# Patient Record
Sex: Female | Born: 1965
Health system: Southern US, Community
[De-identification: ages and names within clinical notes are randomized; demographics above are authoritative.]

## PROBLEM LIST (undated history)

## (undated) DIAGNOSIS — K219 Gastro-esophageal reflux disease without esophagitis: Secondary | ICD-10-CM

## (undated) DIAGNOSIS — M1990 Unspecified osteoarthritis, unspecified site: Secondary | ICD-10-CM

## (undated) DIAGNOSIS — Z9889 Other specified postprocedural states: Secondary | ICD-10-CM

## (undated) DIAGNOSIS — R112 Nausea with vomiting, unspecified: Secondary | ICD-10-CM

## (undated) DIAGNOSIS — G4730 Sleep apnea, unspecified: Secondary | ICD-10-CM

## (undated) DIAGNOSIS — O24419 Gestational diabetes mellitus in pregnancy, unspecified control: Secondary | ICD-10-CM

## (undated) DIAGNOSIS — R569 Unspecified convulsions: Secondary | ICD-10-CM

## (undated) DIAGNOSIS — R519 Headache, unspecified: Secondary | ICD-10-CM

## (undated) DIAGNOSIS — E785 Hyperlipidemia, unspecified: Secondary | ICD-10-CM

## (undated) DIAGNOSIS — J302 Other seasonal allergic rhinitis: Secondary | ICD-10-CM

## (undated) DIAGNOSIS — R51 Headache: Secondary | ICD-10-CM

## (undated) HISTORY — DX: Hyperlipidemia, unspecified: E78.5

## (undated) HISTORY — PX: BLEPHAROPLASTY: SUR158

## (undated) HISTORY — DX: Gastro-esophageal reflux disease without esophagitis: K21.9

## (undated) HISTORY — PX: COLONOSCOPY: SHX174

## (undated) HISTORY — PX: OTHER SURGICAL HISTORY: SHX169

## (undated) HISTORY — DX: Sleep apnea, unspecified: G47.30

## (undated) HISTORY — PX: LIPOSUCTION: SHX10

## (undated) HISTORY — PX: AUGMENTATION MAMMAPLASTY: SUR837

## (undated) HISTORY — PX: ABDOMINAL HYSTERECTOMY: SHX81

---

## 1989-04-26 HISTORY — PX: BREAST SURGERY: SHX581

## 1989-04-26 HISTORY — PX: BREAST ENHANCEMENT SURGERY: SHX7

## 1998-08-27 ENCOUNTER — Other Ambulatory Visit: Admission: RE | Admit: 1998-08-27 | Discharge: 1998-08-27 | Payer: Self-pay | Admitting: Obstetrics & Gynecology

## 2000-03-31 ENCOUNTER — Other Ambulatory Visit: Admission: RE | Admit: 2000-03-31 | Discharge: 2000-03-31 | Payer: Self-pay | Admitting: Obstetrics & Gynecology

## 2000-08-29 ENCOUNTER — Encounter: Payer: Self-pay | Admitting: Physical Medicine & Rehabilitation

## 2000-08-29 ENCOUNTER — Ambulatory Visit (HOSPITAL_COMMUNITY)
Admission: RE | Admit: 2000-08-29 | Discharge: 2000-08-29 | Payer: Self-pay | Admitting: Physical Medicine & Rehabilitation

## 2002-01-11 ENCOUNTER — Other Ambulatory Visit: Admission: RE | Admit: 2002-01-11 | Discharge: 2002-01-11 | Payer: Self-pay | Admitting: Obstetrics & Gynecology

## 2002-02-06 ENCOUNTER — Ambulatory Visit (HOSPITAL_COMMUNITY)
Admission: RE | Admit: 2002-02-06 | Discharge: 2002-02-06 | Payer: Self-pay | Admitting: Physical Medicine & Rehabilitation

## 2002-11-07 ENCOUNTER — Encounter (INDEPENDENT_AMBULATORY_CARE_PROVIDER_SITE_OTHER): Payer: Self-pay

## 2002-11-07 ENCOUNTER — Inpatient Hospital Stay (HOSPITAL_COMMUNITY): Admission: RE | Admit: 2002-11-07 | Discharge: 2002-11-09 | Payer: Self-pay | Admitting: Obstetrics and Gynecology

## 2002-11-07 HISTORY — PX: VAGINAL HYSTERECTOMY: SUR661

## 2004-04-06 ENCOUNTER — Other Ambulatory Visit: Admission: RE | Admit: 2004-04-06 | Discharge: 2004-04-06 | Payer: Self-pay | Admitting: Obstetrics and Gynecology

## 2006-01-04 ENCOUNTER — Ambulatory Visit: Payer: Self-pay | Admitting: Sports Medicine

## 2006-02-18 ENCOUNTER — Ambulatory Visit: Payer: Self-pay | Admitting: Sports Medicine

## 2009-05-19 ENCOUNTER — Ambulatory Visit (HOSPITAL_COMMUNITY): Admission: RE | Admit: 2009-05-19 | Discharge: 2009-05-19 | Payer: Self-pay | Admitting: Specialist

## 2010-05-17 ENCOUNTER — Encounter: Payer: Self-pay | Admitting: Family Medicine

## 2010-05-28 ENCOUNTER — Other Ambulatory Visit: Payer: Self-pay

## 2010-05-28 DIAGNOSIS — R569 Unspecified convulsions: Secondary | ICD-10-CM

## 2010-06-11 ENCOUNTER — Ambulatory Visit (HOSPITAL_COMMUNITY)
Admission: RE | Admit: 2010-06-11 | Discharge: 2010-06-11 | Disposition: A | Discharge status: Home / Self Care | Payer: Commercial Managed Care - PPO | Source: Ambulatory Visit | Attending: Specialist | Admitting: Specialist

## 2010-06-11 ENCOUNTER — Encounter (HOSPITAL_COMMUNITY): Payer: Self-pay

## 2010-06-11 DIAGNOSIS — R569 Unspecified convulsions: Secondary | ICD-10-CM

## 2010-06-11 HISTORY — DX: Unspecified convulsions: R56.9

## 2010-06-11 MED ORDER — GADOBENATE DIMEGLUMINE 529 MG/ML IV SOLN
15.0000 mL | Freq: Once | INTRAVENOUS | Status: AC
  Administered 2010-06-11: 15 mL via INTRAVENOUS

## 2010-09-11 NOTE — H&P (Signed)
[name], [name]                            ACCOUNT NO.:  [id number]   MEDICAL RECORD NO.:  [id number]                   PATIENT TYPE:  INP   LOCATION:  NA                                   FACILITY:  [facility]   PHYSICIAN:  Randye Lobo, M.D.                DATE OF BIRTH:  Feb 12, 1966   DATE OF ADMISSION:  11/07/2002  DATE OF DISCHARGE:                                HISTORY & PHYSICAL   CHIEF COMPLAINT:  Urinary incontinence and pelvic pressure.   HISTORY OF PRESENT ILLNESS:  The patient is a 45 year old gravida 1, para 1,  Caucasian female who has a several-year history of urinary incontinence and  pelvic pressure.  The patient reports leakage of urine with coughing and  sneezing in addition to leakage with sporting activities such as racquetball  and running. The patient also reports urgency with leakage.  In addition,  the patient has reported symptoms of pelvic pressure.  Due to the patient's  symptoms of urinary leakage, she has had to wear mini pads and she has had  to modify her physical activities.  Kegel exercises have not been  satisfactory to the patient.  The patient has also reported a history of  fecal incontinence for which she underwent a colonoscopy in 1998.  The fecal  incontinence has resolved.  The patient denies the use of splinting or  digital pressure for bowel movements.   In the office, the patient was noted to have a second degree cystocele,  first degree uterine prolapse, and a second degree rectocele.  She did  undergo multichannel urodynamic testing on September 25, 2002, which documented a  normal uroflow study with a postvoid residual of 135 mL.  There was no  evidence of genuine stress incontinence and the patient had a stable  cystometrogram.  On the pressure flow study, the patient was noted to have  Valsalva voiding at the end of her void.  The patient was able to void well  at the end of the pressure flow study.   The patient declines any future  childbearing and she wishes for surgical  treatment of both the incontinence and prolapse.   PAST OB GYN HISTORY:  The patient is status post forceps delivery in 1997 of  a female infant weighing 8 pounds 5 ounces. The patient did have a history of  gestational diabetes.   The patient does have a history of low grade dysplasia by colposcopic biopsy  in both 1991 and 1992.  The patient is status post cryotherapy to the cervix  is 1991.  The patient's last Pap smear was performed in September of 2003  and was within normal limits. The patient's last mammogram was performed in  September of 2003 and was within normal limits.  The patient has not  reported any menstrual irregularity. She does have a history of Chlamydia  which was treated in  1991.  The patient uses vasectomy for birth control.   PAST MEDICAL HISTORY:  1. Hypercholesterolemia.  The patient has chosen to take herself off of     Welchol recently.  2. Environmental allergies.  3. History of gestational diabetes mellitus.   PAST SURGICAL HISTORY:  1. Status post wisdom teeth extraction.  2. Status post bilateral breast augmentation in 1991.  3. Status post liposuction of the abdomen in 1999.   MEDICATIONS:  Claritin.   ALLERGIES:  CODEINE.   SOCIAL HISTORY:  The patient is currently separated.   FAMILY HISTORY:  Positive for breast and colon cancer in a paternal  grandmother.  Positive for hypertension in the patient's mother and father.   REVIEW OF SYSTEMS:  Please refer to the history of present illness.   PHYSICAL EXAMINATION:  VITAL SIGNS: Blood pressure is 120/70.  GENERAL: The patient is a Caucasian female of the stated age in no acute  distress.  HEENT:  Normocephalic and atraumatic.  NECK:  Negative for adenopathy and thyromegaly.  LUNGS:  Clear to auscultation bilaterally.  HEART:  S1 and S2 with a regular rate and rhythm.  There is no evidence of a  murmurs, rubs, or gallops.  ABDOMEN:  Soft and nontender  without evidence of hepatosplenomegaly or  organomegaly.  BREASTS:  Consistent with prior breast surgery. There are no palpable  dominant masses, skin retractions, axillary adenopathy, or palpable lymph  nodes.  PELVIC:  Normal external genitalia.  Normal urethra.  There was evidence of  a second degree cystocele, first degree uterine prolapse, and a second  degree rectocele. The cervix is without any grossly visible lesions. The  uterus is noted to be small and nontender. No adnexal masses and no  tenderness are appreciated.  The anus is without lesions.   IMPRESSION:  The patient is a 45 year old gravida 1, para 1 Caucasian female  with postential genuine stress incontinence and pelvic organ prolapse.  The  patient's urinary incontinence is significant enough to modify her activity.   PLAN:  The patient is to have a total vaginal hysterectomy with anterior and  posterior colporrhaphy, TVT procedure, and cystoscopy at [facility] on  November 07, 2002.  Risks, benefits, and alternatives have been discussed with  the patient who wishes to proceed.                                               Randye Lobo, M.D.    BES/MEDQ  D:  11/06/2002  T:  11/06/2002  Job:  [id number]

## 2010-09-11 NOTE — Discharge Summary (Signed)
[name], [name]                            ACCOUNT NO.:  [id number]   MEDICAL RECORD NO.:  [id number]                   PATIENT TYPE:  INP   LOCATION:  9325                                 FACILITY:  [facility]   PHYSICIAN:  Randye Lobo, M.D.                DATE OF BIRTH:  [birth year]   DATE OF ADMISSION:  11/07/2002  DATE OF DISCHARGE:  11/09/2002                                 DISCHARGE SUMMARY   ADMISSION DIAGNOSES:  1. Potential genuine stress incontinence.  2. Pelvic organ prolapse.   DISCHARGE DIAGNOSES:  Status post total vaginal hysterectomy, anterior and  posterior colporrhaphy, TVT procedure, cystoscopy.   SIGNIFICANT OPERATIONS AND PROCEDURES:  The patient underwent a total  vaginal hysterectomy, anterior and posterior colporrhaphy, TVT procedure,  and cystoscopy at the [facility] [place] on November 07, 2002 under  the direction of Randye Lobo, M.D. and with the assistance of Gerrit Friends.  Aldona Bar, M.D.   PERTINENT HISTORY AND PHYSICAL EXAMINATION:  The patient is a 45 year old  gravida 1, para 1 Caucasian female who presented with a several year history  of urinary incontinence and pelvic pressure.  The patient reported that her  urinary incontinence was a significant problem for her and it affected her  activity level.  The patient reported that she was experiencing leakage of  urine with coughing and sneezing in addition to sporting activities such as  racquetball and running.  The patient also had urinary urgency with urge  incontinence.  The patient underwent her multichannel urodynamic testing in  the office.  The normal uroflowmetry study was documented and the patient  had the postvoid residual of 135 mL.  At that time there was no evidence of  genuine stress incontinence.  The patient had a stable cystometrogram.  On  the pressure flow study the patient was noted to have Valsalva voiding.  Her  physical examination documented a second degree cystocele,  first degree  uterine prolapse, and a second degree rectocele.  The cervix demonstrated no  lesions and the uterus was small and nontender.  No adnexal masses.  No  tenderness was appreciated.  The patient declined any future childbearing  and she wished for treatment of both the prolapse and the incontinence after  she was heavily counseled regarding the risks, benefits, and alternatives to  the surgery.   HOSPITAL COURSE:  The patient was admitted on November 07, 2002 at which time  she underwent a total vaginal hysterectomy, anterior/posterior colporrhaphy,  TVT procedure, and cystoscopy under the direction of Randye Lobo, M.D.  and with the assistance of Gerrit Friends. Aldona Bar, M.D.  The estimated blood loss  from surgery was 250 mL and there were no complications.   Postoperatively, the patient required a high dose morphine PCA and Toradol  to control her pain.  However, she did develop some dizziness with the PCA  and  the morphine was therefore discontinued on postoperative day #1 and the  patient began taking Demerol orally which controlled her pain well.   The patient wore PAS stockings and ted hose for DVT prophylaxis throughout  her hospitalization.  The patient was able to ambulate independently on  postoperative day #1.   The patient's diet was slowly advanced to normal which she was also  tolerating at the time of her discharge.  The patient was treated with  Pepcid 10 mg p.o. q.12h. p.r.n. reflux.   The patient began her bladder training on postoperative day #1.  Initially,  her voids ranged from 25-400 mL with postvoid residuals of 200-400 mL.  The  Foley catheter was therefore not discontinued and the patient was discharged  to home with an indwelling catheter.   The patient's final pathology report documented a cervix with no pathologic  abnormalities, benign secretory endometrium, and myometrium and serosa with  no pathologic abnormalities.  The patient's discharge  hematocrit was 30.7  and she was tolerating this well.   The patient was found to be in good condition and ready for discharge on  postoperative day #2.   DISCHARGE INSTRUCTIONS:  1. Discharged to home.  2. The patient will take the following medications:     a. Demerol 50 mg one to two tablets p.o. q.3-4h. p.r.n. pain.     b. Ibuprofen 600 mg p.o. q.6h. p.r.n. pain.     c. Colace 100 mg p.o. b.i.d. p.r.n. stool softening.  3. The patient will have decreased activity for the next six weeks.  She     will lift nothing over 10 pounds for the next three months.  4. Regular diet.  5. The patient will follow up in the office on the Monday following surgery     for Foley catheter removal.  She will call if she experiences problems     with fever, increased pain, increased bleeding, nausea and vomiting, or     any other concern.                                               Randye Lobo, M.D.    BES/MEDQ  D:  12/23/2002  T:  12/24/2002  Job:  [id number]

## 2010-09-11 NOTE — Op Note (Signed)
[name], [name]                            ACCOUNT NO.:  [id number]   MEDICAL RECORD NO.:  [id number]                   PATIENT TYPE:  INP   LOCATION:  9325                                 FACILITY:  [facility]   PHYSICIAN:  Randye Lobo, M.D.                DATE OF BIRTH:  Aug 25, 1965   DATE OF PROCEDURE:  11/07/2002  DATE OF DISCHARGE:                                 OPERATIVE REPORT   PREOPERATIVE DIAGNOSES:  1. Pelvic organ prolapse.  2. Potential genuine stress incontinence.   POSTOPERATIVE DIAGNOSES:  1. Pelvic organ prolapse.  2. Potential genuine stress incontinence.   PROCEDURE:  Total vaginal hysterectomy, anterior and posterior colporrhaphy,  TVT procedure, cystoscopy.   SURGEON:  Randye Lobo, M.D.   ASSISTANT:  Gerrit Friends. Aldona Bar, M.D.   ANESTHESIA:  General endotracheal anesthesia.   IV FLUIDS:  2800 mL Ringers lactate.   ESTIMATED BLOOD LOSS:  250 mL.   URINE OUTPUT:  900 mL.   COMPLICATIONS:  None.   INDICATIONS FOR PROCEDURE:  The patient is a 45 year old gravida 1, para 1  Caucasian female who presented with a several year history of urinary  incontinence and pelvic pressure.  The patient reported leakage of urine  with coughing and sneezing in addition to sporting activities like  racquetball and running which has caused the patient to modify her activity  and use absorbent pads.   On pelvic examination, the patient was noted to have a second degree  cystocele, first degree uterine prolapse, and a second degree rectocele.  She underwent multiple trial urodynamic testing which documented a normal  uroflow study and initially a post void residual of 135 mL.  There was no  evidence of genuine stress incontinence and the CMG was stable.  In the  pressure flow study, the patient was noted to have Valsalva voiding.  The  patient did have a normal void with the pressure flow study with respect to  volume.  The patient declined any future childbearing and  she wished for  surgical treatment for both the incontinence and the prolapse.  The patient  reported that the incontinence was very bothersome to her and she wished to  proceed with surgical treatment after the risks, benefits and alternatives  of her surgery were discussed with her.   FINDINGS:  Examination under anesthesia revealed a second degree cystocele  with hypertrophic vaginal mucosa along the anterior vaginal wall.  There was  evidence of first degree uterine prolapse and a second degree rectocele.  The uterus was noted to be normal as were the bilateral tubes and ovaries.   The cystoscopy documented the absence of a foreign body in the bladder or  urethra.  The bladder was visualized throughout 360 degrees, including the  bladder dome and the trigone.  The ureters were noted to be patent  bilaterally after the injection of indigo carmine dye  intraurethrally.   SPECIMENS:  The uterus was sent to pathology.   DESCRIPTION OF PROCEDURE:  The patient was identified in the preoperative  hold area and was then brought to the operating room where she was placed in  the supine position on the operating room table.  General endotracheal  anesthesia was induced and the patient was then placed in the dorsal  lithotomy position.  The patient did receive Ancef intravenously for  antibiotic prophylaxis and she received both TED hose and PAS stockings for  DVT prophylaxis.  The patient's lower abdomen, perineum and vagina were  sterilely prepped and a Foley catheter was placed inside the bladder.  The  patient was examined and the findings are as noted above.   The procedure began with injection of 0.5% lidocaine with 1/200,000 of  epinephrine circumferentially around the cervix.  The cervix was then  incised circumferentially with a scalpel.  The posterior cul-de-sac was  entered sharply with the Mayo scissors and digital examination confirmed  entry into the peritoneal cavity in the  posterior cul-de-sac.  A weighted  speculum was then placed in the posterior cul-de-sac.   Dissection was performed anteriorly to enter the peritoneal cavity, however,  this was not possible initially.  Each of the uterosacral ligaments were  therefore clamped, sharply divided, and suture ligated with transfixing  sutures of 0 Vicryl.  The descending branches of the uterine arteries were  then sequentially clamped, sharply divided and suture ligated with 0 Vicryl.  Next, the anterior cul-de-sac was entered sharply and a Deaver retractor was  placed in the anterior cul-de-sac.  The remainder of the uterine arteries  were then clamped, sharply divided, and suture ligated with 0 Vicryl  bilaterally.  The broad ligament in the adnexal structures were then  sequentially clamped, sharply divided, and free tied with 0 Vicryl followed  by a suture ligature of the same on each side.  The pedicle sites were re-  examined, and there was some bleeding noted bilaterally at the level of the  uterosacral ligaments which responded to figure-of-eight sutures of 0 Vicryl  inside the vaginal cuff bilaterally.   The pedicles were examined at this time and was found to be hemostatic.  The  posterior vaginal wall was then sutured with a running locked suture of 0  Vicryl.  The posterior culdoplasty was performed next with a suture of 0  Vicryl.  The suture was brought out through the vagina at the 6 o'clock  position up through the uterosacral ligament on the patient's left-hand  side, across the posterior cul-de-sac, and down through the uterosacral  ligament on the patient's right-hand side before coming out the vagina at  the 6 o'clock position.  This was held until the end of the closure of the  anterior vaginal wall at which time the suture was tied.  The anterior  colporrhaphy was performed next.  Allis clamps were used to mark the midline of the vaginal mucosa along the anterior vaginal wall.  The  mucosa was  injected with 0.5% lidocaine with 1:200,000 of epinephrine, and then the  vagina was incised vertically with the Mayo scissors.  The subvaginal tissue  was dissected off of the overlying vaginal mucosa bilaterally, bringing the  bladder away from the vaginal wall mucosa.  This was performed bilaterally.  There was some bleeding noted over the bladder on the patient's left-hand  side which initially did not respond to monopolar cautery, and therefore,  figure-of-eight sutures of 2-0  Vicryl were placed for hemostasis.   Suprapubic incisions were then created with a scalpel to the right and left  of the midline of the pubic symphysis suprapubically.  These were 1 cm  incisions bilaterally.  The TVT was performed at this time by using the  abdominal passers.  On the patient's right-hand side, the abdominal passer  was placed through the suprapubic incision and was guided out through the  vagina just lateral to the bladder and the urethra at the level of the mid  urethra.  The same procedure was performed on the patient's left-handed  side.  The Foley catheter was removed and cystoscopy was performed and the  findings were as noted above.  The mesh was then attached to the abdominal  passers bilaterally and the mesh was drawn up through the suprapubic  incisions.  Cystoscopy was performed one final time and there was no foreign  body in the bladder or urethra.  The tension on the TVT was adjusted at this  time and the plastic sheaths were removed.  The anterior colporrhaphy was  performed by placing vertical mattress sutures of 0 Vicryl for excellent  reduction of the cystocele.  This repair was carried down to the level of  the uterosacral ligaments which were tied together in the midline at this  time.  Excess vaginal mucosa was trimmed away and the anterior vaginal wall  was closed with a running locked suture of 2-0 Vicryl.  This continued to  include the mucosa which had been  previously sutured with that running  locked suture of 0 Vicryl at the termination of the hysterectomy.  The  McCall culdoplasty suture was tied at this time.   The posterior colporrhaphy was performed last.  Allis clamps were used to  mark the midline of the vagina posteriorly prior to injection with 0.5%  lidocaine with 1:200,000 of epinephrine.  A triangular wedge of tissue was  sharply excised from the perineal body.  The midline of the vagina was then  incised sharply with the Mayo scissors.  The perirectal fascia was dissected  off of the overlying vaginal mucosa bilaterally.  The series of vertical  mattress sutures were then placed for reduction of the rectocele.  One  suture which was placed at the top of the posterior colporrhaphy did include  a superficial suture which was also placed through the vaginal mucosa.  The  excess vaginal mucosa was trimmed away and the posterior vagina was closed with a running locked suture of 2-0 Vicryl.  This was continued along the  perineal body as for an episiotomy repair.  The knot was tied inside the  hymenal ring.  There was excellent vaginal caliber and length at the  termination of the surgery.  Packing with Iodoform was placed inside the  vagina.  The suprapubic incisions were closed with Dermabond after they were  made hemostatic with monopolar cautery.  A rectal examination confirmed the  absence of sutures in the rectum.   This concluded the patient's surgery.  All sponge, needle and instrument  counts were correct.                                               Randye Lobo, M.D.    BES/MEDQ  D:  11/07/2002  T:  11/07/2002  Job:  [id number]

## 2011-02-15 ENCOUNTER — Inpatient Hospital Stay (INDEPENDENT_AMBULATORY_CARE_PROVIDER_SITE_OTHER)
Admission: RE | Admit: 2011-02-15 | Discharge: 2011-02-15 | Disposition: A | Discharge status: Home / Self Care | Payer: Commercial Managed Care - PPO | Source: Ambulatory Visit | Attending: Emergency Medicine | Admitting: Emergency Medicine

## 2011-02-15 ENCOUNTER — Ambulatory Visit (INDEPENDENT_AMBULATORY_CARE_PROVIDER_SITE_OTHER): Payer: Commercial Managed Care - PPO

## 2011-02-15 DIAGNOSIS — S60229A Contusion of unspecified hand, initial encounter: Secondary | ICD-10-CM

## 2011-09-21 ENCOUNTER — Encounter: Payer: Self-pay | Admitting: *Deleted

## 2011-09-21 ENCOUNTER — Encounter: Payer: 59 | Attending: Family Medicine | Admitting: *Deleted

## 2011-09-21 VITALS — Ht 66.0 in | Wt 158.1 lb

## 2011-09-21 DIAGNOSIS — E7800 Pure hypercholesterolemia, unspecified: Secondary | ICD-10-CM

## 2011-09-21 DIAGNOSIS — Z713 Dietary counseling and surveillance: Secondary | ICD-10-CM | POA: Insufficient documentation

## 2011-09-21 NOTE — Progress Notes (Signed)
Medical Nutrition Therapy:  Appt start time: 1145 end time:  1245.  Assessment:  Primary concerns today: hyperlipidemia.   MEDICATIONS: see list   DIETARY INTAKE:  Usual eating pattern includes 2-3 meals and 1-2 snacks per day.  Everyday foods include  smoothies, lean meats, diet sodas.  Avoided foods include oatmeal, cheese, mayonnaise/condiments, tomatoes, kiwi, avacado.    24-hr recall:  B ( AM): honey nut cheerios with 1% milk , greek yogurt, bacon and egg biscuit or egg mcmuffin 1 day a week. Diet soda Snk ( AM): none  L ( PM): salad not often, usually eats out [place] (pizza, pasta).  Eats at home during summer months with son: smoothie, could be anything, diet soda Snk ( PM): marshmallow puff, smoothie (mangos, pineapple, peaches, strawberries, with juice and smoothie packet, 2 scoops of orange serbert) D ( PM): lean meats, honey chipotle salmon, cube steak, chicken.  Usually grill meats, fried meats sometimes; sometimes has rice or noodles; vegetables (3 nights a week), diet soda Snk ( PM): diet soda; skinny cow cookie, pretzels (100 calorie packs) Beverages: diet soda, water when working out   Usual physical activity: BID, ride bike or walk, no weight lifting just cardio, racquetball   Estimated energy needs: 1800 calories 50 g fat  Progress Towards Goal(s):  In progress.   Nutritional Diagnosis:  NI-5.6.2 Excessive fat intake ss related to freqently eating out.  As evidenced by hypercholesterolemia.    Intervention:  Nutrition counseling provided.  Discussed sources of dietary fats and cholesterol.  Encouraged limiting saturated and trans farts, as well as dietary cholesterol.  Encouraged continued use of olive oils in cooking.  Encouraged whole grains and more fruits and vegetables to bring down cholesterol levels.  Discouraged meal skipping in an effort to maintain healthy weight.  Patient is very physically active, but doesn't do weight training.  Adding resistance  training could boost the effectiveness of her workout and help her loose more weight.  She eats out for lunch often and consumes a lot of fatty dessert like custards and baked items.  Discouraged this practice.  Client did not seem interested in making many dietary change.    Handouts given during visit include:  TLC diet  Plan: Try to eat 3 meals every day Continue to choose lean meats Try to increase whole grains by adding one serving a day Try to eat 1 serving of vegetables (1/2 cup cooked or 1 cup raw) every day Start reading fool labels and aim for 5-10 g fat per serving Limit high fat baked goods and dessert.  Try more serberts and frozen yogurt   Monitoring/Evaluation:  Dietary intake, exercise, and body weight prn.

## 2011-09-21 NOTE — Patient Instructions (Signed)
Try to eat 3 meals every day Continue to Choose lean meats Try to increase whole grains by adding one serving a day Try to eat 1 serving of vegetables (1/2 cup cooked or 1 cup raw) every day Start reading fool labels and aim for 5-10 g fat per serving Limit high fat baked goods and dessert.  Try more serberts and frozen yogurt

## 2013-03-13 ENCOUNTER — Encounter: Payer: Self-pay | Admitting: Physical Medicine & Rehabilitation

## 2013-03-13 ENCOUNTER — Encounter: Payer: 59 | Attending: Physical Medicine & Rehabilitation | Admitting: Physical Medicine & Rehabilitation

## 2013-03-13 ENCOUNTER — Ambulatory Visit
Admission: RE | Admit: 2013-03-13 | Discharge: 2013-03-13 | Disposition: A | Discharge status: Home / Self Care | Payer: 59 | Source: Ambulatory Visit | Attending: Physical Medicine & Rehabilitation | Admitting: Physical Medicine & Rehabilitation

## 2013-03-13 VITALS — BP 145/91 | HR 48 | Resp 14 | Ht 65.0 in | Wt 166.0 lb

## 2013-03-13 DIAGNOSIS — M25519 Pain in unspecified shoulder: Secondary | ICD-10-CM | POA: Insufficient documentation

## 2013-03-13 DIAGNOSIS — M6790 Unspecified disorder of synovium and tendon, unspecified site: Secondary | ICD-10-CM

## 2013-03-13 DIAGNOSIS — M542 Cervicalgia: Secondary | ICD-10-CM | POA: Insufficient documentation

## 2013-03-13 DIAGNOSIS — M7918 Myalgia, other site: Secondary | ICD-10-CM

## 2013-03-13 DIAGNOSIS — IMO0001 Reserved for inherently not codable concepts without codable children: Secondary | ICD-10-CM

## 2013-03-13 DIAGNOSIS — M67911 Unspecified disorder of synovium and tendon, right shoulder: Secondary | ICD-10-CM

## 2013-03-13 MED ORDER — MELOXICAM 15 MG PO TABS: 15.0000 mg | ORAL_TABLET | Freq: Every day | ORAL | Status: DC

## 2013-03-13 NOTE — Progress Notes (Signed)
Subjective:    Patient ID: [name], female    DOB: 08/01/1965, 47 y.o.   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  She woke up the next morning with her neck even more stiff. The majority of pain is in her neck and right shoulder. She has had tingling in her right 4th/5th fingers which is intermittent and happens when she is moving around. She has also had pain in her right shoulder blade area. She denies numbness in her arm. The arm feels "tired."  She has a remote history of a rotator cuff injury which has been generally controlled.   For relief, she's had a prednisone taper, ibuprofen which haven't helped. She couldn't tolerate robaxin. She has tried heat and ice which help temporarily but no long lasting effects. She had some massage done by one of the OT's at the hospital which helped for a few hours. A prior massage didn't help as much.   Taline stays very active, biking some weeks, playing racquetball, walking, etc. 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      Pain Inventory Average Pain 3 Pain Right Now 3 My pain is dull and aching  In the last 24 hours, has pain interfered with the following? General activity 2 Relation with others 0 Enjoyment of life 3 What TIME of day is your pain at its worst? evening Sleep (in general) Fair  Pain is worse with: unsure Pain improves with: nothing Relief from Meds: 5  Mobility walk without assistance do you drive?  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  no  Physicians involved in your care Any changes since last visit?  no   Family History  Problem Relation Age of Onset  . Hypertension Other   .  Hyperlipidemia Other    History   Social History  . Marital Status: Single    Spouse Name: N/A    Number of Children: N/A  . Years of Education: N/A   Social History Main Topics  . Smoking status: Never Smoker   . Smokeless tobacco: Never Used  . Alcohol Use: No  . Drug Use: No  . Sexual Activity: None   Other Topics Concern  . None   Social History Narrative  . None   Past Surgical History  Procedure Laterality Date  . Abdominal hysterectomy    . Breast surgery     Past Medical History  Diagnosis Date  . Seizure     after taking sudafed   . Hyperlipidemia    BP 145/91  Pulse 48  Resp 14  Ht 5\' 5"  (1.651 m)  Wt 166 lb (75.297 kg)  BMI 27.62 kg/m2  SpO2 99%  LMP 05/28/2010    Review of Systems  Musculoskeletal: Positive for neck pain.  Neurological: Positive for weakness and headaches.  All other systems reviewed and are negative.       Objective:   Physical Exam  General: Alert and oriented x 3, No apparent distress HEENT: Head is normocephalic, atraumatic, PERRLA, EOMI, sclera anicteric, oral mucosa pink and moist, dentition intact, ext ear canals clear,  Neck: Supple without JVD or lymphadenopathy  Heart: Reg rate and rhythm. No murmurs rubs or gallops Chest: CTA bilaterally without wheezes, rales, or rhonchi; no distress Abdomen: Soft, non-tender, non-distended, bowel sounds positive. Extremities: No clubbing, cyanosis, or edema. Pulses are 2+ Skin: Clean and intact without signs of breakdown Neuro: Pt is cognitively appropriate with normal insight, memory, and awareness. Cranial nerves 2-12 are intact. Sensory exam is normal. Reflexes are 2+ in all 4's. Fine motor coordination is intact. No tremors. Motor function is grossly 5/5. Tinel's sign is negative. Spurling's maneuver negative.  Musculoskeletal: good posture and head position. Right trap with trigger point centrally and superiorly. Levator scapulae was also notable for a TP. Rhomboids are  generally tender but i didn't palpate any focal trigger points. Scapulae were balanced and in generally a neutral position. Her gait pattern is normal.  Psych: Pt's affect is appropriate. Pt is cooperative         Assessment & Plan:  1. Myofascial cervcial and shoulder girdle pain since late October 2. ?Cervical spondylosis---I see no signs of neurological involvement on exam today. 3. History of remote rotator cuff tendonitis    Plan: 1. Xrays of cervical spine were ordered to assess disk spaces and facet joints 2. Mobic for anti-inflammatory effects 3. Will make a referral to outpatient rehab to address cervical ROM, myofascial techniques, strengthening, modalities, and HEP--she will do this at [facility] in [place] 4. Consider TPI's to right trap, levator scapulae, ? Splenius capitis at next visit. 5. Follow up with me in one month. 30 minutes of face to face patient care time were spent during this visit. All questions were encouraged and answered.

## 2013-03-13 NOTE — Patient Instructions (Signed)
CALL ME WITH ANY PROBLEMS OR QUESTIONS (#[phone]).  HAVE A GOOD DAY  

## 2013-03-14 ENCOUNTER — Telehealth: Payer: Self-pay | Admitting: Physical Medicine & Rehabilitation

## 2013-04-24 ENCOUNTER — Encounter: Payer: Self-pay | Admitting: Physical Medicine & Rehabilitation

## 2013-04-24 ENCOUNTER — Encounter: Payer: 59 | Attending: Physical Medicine & Rehabilitation | Admitting: Physical Medicine & Rehabilitation

## 2013-04-24 VITALS — BP 143/80 | HR 56 | Resp 14 | Ht 65.0 in | Wt 172.0 lb

## 2013-04-24 DIAGNOSIS — M7918 Myalgia, other site: Secondary | ICD-10-CM

## 2013-04-24 DIAGNOSIS — IMO0001 Reserved for inherently not codable concepts without codable children: Secondary | ICD-10-CM

## 2013-04-24 DIAGNOSIS — M545 Low back pain, unspecified: Secondary | ICD-10-CM | POA: Insufficient documentation

## 2013-04-24 DIAGNOSIS — M542 Cervicalgia: Secondary | ICD-10-CM

## 2013-04-24 DIAGNOSIS — M6790 Unspecified disorder of synovium and tendon, unspecified site: Secondary | ICD-10-CM

## 2013-04-24 DIAGNOSIS — M67911 Unspecified disorder of synovium and tendon, right shoulder: Secondary | ICD-10-CM

## 2013-04-24 NOTE — Patient Instructions (Signed)
CONTINUE WITH APPROPRIATE POSTURE AND TECHNIQUE WHILE YOU WORK

## 2013-04-24 NOTE — Progress Notes (Signed)
Subjective:    Patient ID: [name], female    DOB: 22-Jan-1966, 47 y.o.   MRN: [id number]  HPI  [name] is back regarding her cervicalgia and shoulder girdle pain. She has completed physical therapy and has good results with near resolution of her pain. She is occasionally a little tight in the mornings but is feeling very well. She is using her meloxicam only if needed after athletic activity or if pain is a little worse.   She has noticed some low back pain at times over the last month. 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 The pain is non radiating and tends to be more on the right side than left.   Pain Inventory Average Pain 1 Pain Right Now 0 My pain is intermittent  In the last 24 hours, has pain interfered with the following? General activity 0 Relation with others 0 Enjoyment of life 0 What TIME of day is your pain at its worst? morning Sleep (in general) Good  Pain is worse with: unsure Pain improves with: therapy/exercise and medication Relief from Meds: 0  Mobility Do you have any goals in this area?  no  Function Do you have any goals in this area?  no  Neuro/Psych No problems in this area  Prior Studies Any changes since last visit?  no  Physicians involved in your care Any changes since last visit?  no   Family History  Problem Relation Age of Onset  . Hypertension Other   . Hyperlipidemia Other    History   Social History  . Marital Status: Single    Spouse Name: N/A    Number of Children: N/A  . Years of Education: N/A   Social History Main Topics  . Smoking status: Never Smoker   . Smokeless tobacco: Never Used  . Alcohol Use: No  . Drug Use: No  . Sexual Activity: None   Other Topics Concern  . None   Social History Narrative  . None   Past Surgical History  Procedure Laterality Date  . Abdominal hysterectomy    . Breast surgery     Past Medical History  Diagnosis Date  . Seizure    after taking sudafed   . Hyperlipidemia    BP 143/80  Pulse 56  Resp 14  Ht 5\' 5"  (1.651 m)  Wt 172 lb (78.019 kg)  BMI 28.62 kg/m2  SpO2 100%  LMP 05/28/2010     Review of Systems  Musculoskeletal: Positive for back pain and neck pain.  All other systems reviewed and are negative.       Objective:   Physical Exam General: Alert and oriented x 3, No apparent distress  HEENT: Head is normocephalic, atraumatic, PERRLA, EOMI, sclera anicteric, oral mucosa pink and moist, dentition intact, ext ear canals clear,  Neck: Supple without JVD or lymphadenopathy  Heart: Reg rate and rhythm. No murmurs rubs or gallops  Chest: CTA bilaterally without wheezes, rales, or rhonchi; no distress  Abdomen: Soft, non-tender, non-distended, bowel sounds positive.  Extremities: No clubbing, cyanosis, or edema. Pulses are 2+  Skin: Clean and intact without signs of breakdown  Neuro: Pt is cognitively appropriate with normal insight, memory, and awareness. Cranial nerves 2-12 are intact. Sensory exam is normal. Reflexes are 2+ in all 4's. Fine motor coordination is intact. No tremors. Motor function is grossly 5/5. Tinel's sign is negative. Spurling's maneuver negative.  Musculoskeletal: Rhomboid and traps/levators are less tense.  Scapulae  were balanced and in  a neutral position. Her gait pattern is normal. Lumbar paraspinals taut on the right with some rotation clockwise. Pelvis rotated as well. She is able to flex to around 90 degrees. Psych: Pt's affect is appropriate. Pt is cooperative   Assessment & Plan:   1. Myofascial cervcial and shoulder girdle pain since late October  2. ?Cervical spondylosis---I see no signs of neurological involvement on exam today.  3. History of remote rotator cuff tendonitis  4. Lumbar spondylosis and associated myofascial pain    Plan:  1. Continue with HEP and good posture and techniques at work.  2. Mobic for anti-inflammatory effects can be used PRN 3.  Will make a second referral to PT for low back mobilization and range, modalities. The same applies to posture and technique in regard to her lumbar spine as well.    4. Follow up with me in 3 month. 15 minutes of face to face patient care time were spent during this visit. All questions were encouraged and answered.

## 2013-07-17 ENCOUNTER — Encounter: Payer: Self-pay | Admitting: Physical Medicine & Rehabilitation

## 2013-07-17 ENCOUNTER — Encounter: Payer: 59 | Attending: Physical Medicine & Rehabilitation | Admitting: Physical Medicine & Rehabilitation

## 2013-07-17 VITALS — BP 118/75 | HR 62 | Resp 14 | Ht 65.0 in | Wt 166.0 lb

## 2013-07-17 DIAGNOSIS — M545 Low back pain, unspecified: Secondary | ICD-10-CM

## 2013-07-17 DIAGNOSIS — IMO0001 Reserved for inherently not codable concepts without codable children: Secondary | ICD-10-CM | POA: Insufficient documentation

## 2013-07-17 DIAGNOSIS — M719 Bursopathy, unspecified: Secondary | ICD-10-CM | POA: Insufficient documentation

## 2013-07-17 DIAGNOSIS — M7918 Myalgia, other site: Secondary | ICD-10-CM

## 2013-07-17 DIAGNOSIS — M6790 Unspecified disorder of synovium and tendon, unspecified site: Secondary | ICD-10-CM | POA: Insufficient documentation

## 2013-07-17 DIAGNOSIS — M542 Cervicalgia: Secondary | ICD-10-CM

## 2013-07-17 DIAGNOSIS — M67911 Unspecified disorder of synovium and tendon, right shoulder: Secondary | ICD-10-CM

## 2013-07-17 MED ORDER — METHOCARBAMOL 500 MG PO TABS: 500.0000 mg | ORAL_TABLET | Freq: Four times a day (QID) | ORAL | Status: DC | PRN

## 2013-07-17 NOTE — Progress Notes (Signed)
Subjective:    Patient ID: [name], female    DOB: 15-Aug-1965, 48 y.o.   MRN: [id number]  HPI  Felisia is back regarding her her axial pain. For the most part she is doing very well. She is having an occasional "twinge" in her right low back and her upper back feels tight on occasion.  However, she feels MUCH better than before. She is winding down with PT currently.   She is active with exercising and ready to get back into her "heavy" biking season.   She has a 1/4 inch heel insert in the right shoe which has helped balance her pelvis out---it took some getting used to.  She is only taking meloxicam for pain  Pain Inventory Average Pain 3 Pain Right Now 2 My pain is dull  In the last 24 hours, has pain interfered with the following? General activity 0 Relation with others 0 Enjoyment of life 0 What TIME of day is your pain at its worst? evening Sleep (in general) Good  Pain is worse with: bending Pain improves with: rest, heat/ice and medication Relief from Meds: 9  Mobility walk without assistance ability to climb steps?  yes do you drive?  yes transfers alone Do you have any goals in this area?  no  Function employed # of hrs/week na Do you have any goals in this area?  no  Neuro/Psych No problems in this area  Prior Studies Any changes since last visit?  no  Physicians involved in your care Any changes since last visit?  no   Family History  Problem Relation Age of Onset  . Hypertension Other   . Hyperlipidemia Other    History   Social History  . Marital Status: Single    Spouse Name: N/A    Number of Children: N/A  . Years of Education: N/A   Social History Main Topics  . Smoking status: Never Smoker   . Smokeless tobacco: Never Used  . Alcohol Use: No  . Drug Use: No  . Sexual Activity: None   Other Topics Concern  . None   Social History Narrative  . None   Past Surgical History  Procedure Laterality Date  . Abdominal  hysterectomy    . Breast surgery     Past Medical History  Diagnosis Date  . Seizure     after taking sudafed   . Hyperlipidemia    BP 118/75  Pulse 62  Resp 14  Ht 5\' 5"  (1.651 m)  Wt 166 lb (75.297 kg)  BMI 27.62 kg/m2  SpO2 98%  LMP 05/28/2010  Opioid Risk Score:   Fall Risk Score: Low Fall Risk (0-5 points) (pt educated and given brochure on fall risk previously)   Review of Systems  Musculoskeletal: Positive for back pain.  All other systems reviewed and are negative.       Objective:   Physical Exam  General: Alert and oriented x 3, No apparent distress  HEENT: Head is normocephalic, atraumatic, PERRLA, EOMI, sclera anicteric, oral mucosa pink and moist, dentition intact, ext ear canals clear,  Neck: Supple without JVD or lymphadenopathy  Heart: Reg rate and rhythm. No murmurs rubs or gallops  Chest: CTA bilaterally without wheezes, rales, or rhonchi; no distress  Abdomen: Soft, non-tender, non-distended, bowel sounds positive.  Extremities: No clubbing, cyanosis, or edema. Pulses are 2+  Skin: Clean and intact without signs of breakdown  Neuro: Pt is cognitively appropriate with normal insight, memory, and awareness. Cranial  nerves 2-12 are intact. Sensory exam is normal. Reflexes are 2+ in all 4's. Fine motor coordination is intact. No tremors. Motor function is grossly 5/5. Tinel's sign is negative. Spurling's maneuver negative.  Musculoskeletal: Rhomboid and traps/levators are less tense but still tighter on the right. Sharma Covert were balanced and in a neutral position. Her gait pattern is normal. Lumbar paraspinals taut on the right again but non tender. . Pelvis rotated as well. She is able to flex to around 90 degrees--and her overall ROM is much improved. Psych: Pt's affect is appropriate. Pt is cooperative.     Assessment & Plan:   1. Myofascial cervcial and shoulder girdle pain since late October  2. ?Cervical spondylosis---I see no signs of neurological  involvement on exam today.  3. History of remote rotator cuff tendonitis  4. Lumbar spondylosis and associated myofascial pain--improved.     Plan:  1. Continue with HEP and good posture and techniques at work.   2. Mobic for anti-inflammatory effects can be used PRN. I also gave her 30 robaxin to use for breakthrough spasm although the focus will be on #3. 3. Continue with HEP. Focusing on posture stretches from shoulder girdle to lumbar spine--continue 1/4 inch shoe insert. 4. Follow up with me  PRN. 15 minutes of face to face patient care time were spent during this visit. All questions were encouraged and answered. She is doing very well!

## 2013-07-17 NOTE — Patient Instructions (Signed)
CONTINUE TO FOCUS ON STRETCHING AND GOOD POSTURE MOVING FORWARD

## 2014-06-16 NOTE — Telephone Encounter (Signed)
na

## 2015-05-15 DIAGNOSIS — Z01419 Encounter for gynecological examination (general) (routine) without abnormal findings: Secondary | ICD-10-CM | POA: Diagnosis not present

## 2015-05-15 MED FILL — PRAVASTATIN SODIUM 80 MG TA: 80 | 90 days supply | Qty: 90 | Fill #0

## 2015-05-15 MED FILL — ARMODAFINIL 150 MG TABLET: 150 | 30 days supply | Qty: 30 | Fill #0

## 2015-05-15 MED FILL — MELOXICAM 15 MG TABLET: 15 | 90 days supply | Qty: 90 | Fill #0

## 2015-06-14 DIAGNOSIS — J019 Acute sinusitis, unspecified: Secondary | ICD-10-CM | POA: Diagnosis not present

## 2015-07-21 MED FILL — ARMODAFINIL 150 MG TABLET: 150 | 30 days supply | Qty: 30 | Fill #1

## 2015-07-22 DIAGNOSIS — Z1231 Encounter for screening mammogram for malignant neoplasm of breast: Secondary | ICD-10-CM | POA: Diagnosis not present

## 2015-07-28 DIAGNOSIS — C44519 Basal cell carcinoma of skin of other part of trunk: Secondary | ICD-10-CM | POA: Diagnosis not present

## 2015-07-28 DIAGNOSIS — L820 Inflamed seborrheic keratosis: Secondary | ICD-10-CM | POA: Diagnosis not present

## 2015-09-09 MED FILL — XIIDRA 5% EYE DROPS: 5 | 90 days supply | Qty: 180 | Fill #1

## 2015-10-02 MED FILL — PRAVASTATIN SODIUM 80 MG TA: 80 | 90 days supply | Qty: 90 | Fill #1

## 2015-10-13 DIAGNOSIS — H04123 Dry eye syndrome of bilateral lacrimal glands: Secondary | ICD-10-CM | POA: Diagnosis not present

## 2015-11-18 DIAGNOSIS — Z1211 Encounter for screening for malignant neoplasm of colon: Secondary | ICD-10-CM | POA: Diagnosis not present

## 2015-12-22 DIAGNOSIS — K921 Melena: Secondary | ICD-10-CM | POA: Diagnosis not present

## 2015-12-22 DIAGNOSIS — R1013 Epigastric pain: Secondary | ICD-10-CM | POA: Diagnosis not present

## 2015-12-22 DIAGNOSIS — Z8371 Family history of colonic polyps: Secondary | ICD-10-CM | POA: Diagnosis not present

## 2016-01-15 MED FILL — ARMODAFINIL 150 MG TABLET: 150 | 30 days supply | Qty: 30 | Fill #0

## 2016-01-21 MED FILL — GAVILYTE-N SOLUTION: 420 | 1 days supply | Qty: 4000 | Fill #0

## 2016-01-23 ENCOUNTER — Other Ambulatory Visit: Payer: Self-pay | Admitting: Gastroenterology

## 2016-01-26 ENCOUNTER — Encounter (HOSPITAL_COMMUNITY): Payer: Self-pay | Admitting: *Deleted

## 2016-01-26 NOTE — Progress Notes (Signed)
Spoke with pt for pre-op call. Pt denies cardiac history, chest pain or sob. 

## 2016-01-27 ENCOUNTER — Ambulatory Visit (HOSPITAL_COMMUNITY): Payer: 59 | Admitting: Certified Registered Nurse Anesthetist

## 2016-01-27 ENCOUNTER — Encounter (HOSPITAL_COMMUNITY)
Admission: RE | Disposition: A | Discharge status: Home / Self Care | Payer: Self-pay | Source: Ambulatory Visit | Attending: Gastroenterology

## 2016-01-27 ENCOUNTER — Encounter (HOSPITAL_COMMUNITY): Payer: Self-pay

## 2016-01-27 ENCOUNTER — Ambulatory Visit (HOSPITAL_COMMUNITY)
Admission: RE | Admit: 2016-01-27 | Discharge: 2016-01-27 | Disposition: A | Discharge status: Home / Self Care | Payer: 59 | Source: Ambulatory Visit | Attending: Gastroenterology | Admitting: Gastroenterology

## 2016-01-27 DIAGNOSIS — Z1211 Encounter for screening for malignant neoplasm of colon: Secondary | ICD-10-CM | POA: Diagnosis not present

## 2016-01-27 DIAGNOSIS — K921 Melena: Secondary | ICD-10-CM | POA: Diagnosis not present

## 2016-01-27 DIAGNOSIS — K648 Other hemorrhoids: Secondary | ICD-10-CM | POA: Diagnosis not present

## 2016-01-27 DIAGNOSIS — Z8371 Family history of colonic polyps: Secondary | ICD-10-CM | POA: Insufficient documentation

## 2016-01-27 DIAGNOSIS — R195 Other fecal abnormalities: Secondary | ICD-10-CM | POA: Diagnosis not present

## 2016-01-27 DIAGNOSIS — K5730 Diverticulosis of large intestine without perforation or abscess without bleeding: Secondary | ICD-10-CM | POA: Insufficient documentation

## 2016-01-27 DIAGNOSIS — R109 Unspecified abdominal pain: Secondary | ICD-10-CM | POA: Diagnosis not present

## 2016-01-27 DIAGNOSIS — K449 Diaphragmatic hernia without obstruction or gangrene: Secondary | ICD-10-CM | POA: Diagnosis not present

## 2016-01-27 DIAGNOSIS — R1013 Epigastric pain: Secondary | ICD-10-CM | POA: Diagnosis not present

## 2016-01-27 HISTORY — DX: Gestational diabetes mellitus in pregnancy, unspecified control: O24.419

## 2016-01-27 HISTORY — DX: Other specified postprocedural states: Z98.890

## 2016-01-27 HISTORY — PX: ESOPHAGOGASTRODUODENOSCOPY (EGD) WITH PROPOFOL: SHX5813

## 2016-01-27 HISTORY — DX: Unspecified osteoarthritis, unspecified site: M19.90

## 2016-01-27 HISTORY — DX: Other specified postprocedural states: R11.2

## 2016-01-27 HISTORY — DX: Other seasonal allergic rhinitis: J30.2

## 2016-01-27 HISTORY — DX: Headache, unspecified: R51.9

## 2016-01-27 HISTORY — PX: COLONOSCOPY WITH PROPOFOL: SHX5780

## 2016-01-27 HISTORY — DX: Headache: R51

## 2016-01-27 SURGERY — ESOPHAGOGASTRODUODENOSCOPY (EGD) WITH PROPOFOL
Anesthesia: Monitor Anesthesia Care

## 2016-01-27 MED ORDER — SODIUM CHLORIDE 0.9 % IV SOLN: INTRAVENOUS | Status: DC

## 2016-01-27 MED ORDER — LACTATED RINGERS IV SOLN: INTRAVENOUS | Status: DC | PRN

## 2016-01-27 MED ORDER — FENTANYL CITRATE (PF) 100 MCG/2ML IJ SOLN
INTRAMUSCULAR | Status: DC | PRN
  Administered 2016-01-27 (×2): 25 ug via INTRAVENOUS
  Administered 2016-01-27: 50 ug via INTRAVENOUS

## 2016-01-27 MED ORDER — ONDANSETRON HCL 4 MG/2ML IJ SOLN
INTRAMUSCULAR | Status: DC | PRN
  Administered 2016-01-27 (×2): 4 mg via INTRAVENOUS

## 2016-01-27 MED ORDER — PROPOFOL 500 MG/50ML IV EMUL
INTRAVENOUS | Status: DC | PRN
  Administered 2016-01-27: 100 ug/kg/min via INTRAVENOUS

## 2016-01-27 MED ORDER — BUTAMBEN-TETRACAINE-BENZOCAINE 2-2-14 % EX AERO
INHALATION_SPRAY | CUTANEOUS | Status: DC | PRN
  Administered 2016-01-27: 2 via TOPICAL

## 2016-01-27 MED ORDER — MIDAZOLAM HCL 5 MG/5ML IJ SOLN
INTRAMUSCULAR | Status: DC | PRN
  Administered 2016-01-27 (×2): 1 mg via INTRAVENOUS

## 2016-01-27 MED ORDER — LACTATED RINGERS IV SOLN
INTRAVENOUS | Status: DC | PRN
  Administered 2016-01-27: 08:00:00 via INTRAVENOUS

## 2016-01-27 NOTE — Anesthesia Postprocedure Evaluation (Signed)
Anesthesia Post Note  Patient: KANIA HORBAL  Procedure(s) Performed: Procedure(s) (LRB): ESOPHAGOGASTRODUODENOSCOPY (EGD) WITH PROPOFOL (N/A) COLONOSCOPY WITH PROPOFOL (N/A)  Patient location during evaluation: PACU Anesthesia Type: MAC Level of consciousness: awake and alert Pain management: pain level controlled Vital Signs Assessment: post-procedure vital signs reviewed and stable Respiratory status: spontaneous breathing, nonlabored ventilation, respiratory function stable and patient connected to nasal cannula oxygen Cardiovascular status: stable and blood pressure returned to baseline Anesthetic complications: no    Last Vitals:  Vitals:   01/27/16 0737 01/27/16 0949  BP: (!) 148/75 (!) 116/57  Pulse: 61 (!) 57  Resp: 13 13  Temp: 36.6 C     Last Pain:  Vitals:   01/27/16 0737  TempSrc: Oral                 Zenaida Deed

## 2016-01-27 NOTE — Anesthesia Preprocedure Evaluation (Signed)
Anesthesia Evaluation  Patient identified by MRN, date of birth, ID band Patient awake    Reviewed: Allergy & Precautions, H&P , NPO status , Patient's Chart, lab work & pertinent test results  History of Anesthesia Complications (+) PONVNegative for: history of anesthetic complications  Airway Mallampati: II  TM Distance: >3 FB Neck ROM: full    Dental no notable dental hx.    Pulmonary neg pulmonary ROS,    Pulmonary exam normal breath sounds clear to auscultation       Cardiovascular negative cardio ROS Normal cardiovascular exam Rhythm:regular Rate:Normal     Neuro/Psych  Headaches,    GI/Hepatic negative GI ROS, Neg liver ROS,   Endo/Other  neg diabetes  Renal/GU negative Renal ROS     Musculoskeletal  (+) Arthritis ,   Abdominal   Peds  Hematology negative hematology ROS (+)   Anesthesia Other Findings   Reproductive/Obstetrics negative OB ROS                             Anesthesia Physical Anesthesia Plan  ASA: II  Anesthesia Plan: MAC   Post-op Pain Management:    Induction: Intravenous  Airway Management Planned: Nasal Cannula  Additional Equipment:   Intra-op Plan:   Post-operative Plan:   Informed Consent: I have reviewed the patients History and Physical, chart, labs and discussed the procedure including the risks, benefits and alternatives for the proposed anesthesia with the patient or authorized representative who has indicated his/her understanding and acceptance.   Dental Advisory Given  Plan Discussed with: Anesthesiologist, CRNA and Surgeon  Anesthesia Plan Comments:         Anesthesia Quick Evaluation

## 2016-01-27 NOTE — Progress Notes (Signed)
[name] 8:25 AM  Subjective: Patient doing well without any new complaints since we saw her recently in the office  Objective: Vital signs stable afebrile no acute distress exam please see preassessment evaluation  Assessment: Multiple GI complaints due for colonic screening  Plan: Okay to proceed with colonoscopy and endoscopy with anesthesia assistance  [facility]  Pager [phone] After 5PM or if no answer call [phone]

## 2016-01-27 NOTE — Op Note (Signed)
[facility] Patient Name: [name] Procedure Date : 01/27/2016 MRN: [id number] Attending MD: Clarene Essex , MD Date of Birth: 07-23-1965 CSN: [id number] Age: 50 Admit Type: Outpatient Procedure:                Colonoscopy Indications:              Screening for colorectal malignant neoplasm, This                            is the patient's first colonoscopy, Incidental -                            Hematochezia, Family history of colonic polyps in a                            first-degree relative Providers:                Clarene Essex, MD, Carolynn Comment, RN, Ralene Bathe,                            Technician, Clearnce Sorrel, CRNA Referring MD:              Medicines:                Fentanyl 75 micrograms IV, Midazolam 2 mg IV,                            Ondansetron 4 mg IV, Propofol total dose [id number] mg IV Complications:            No immediate complications. Estimated Blood Loss:     Estimated blood loss: none. Procedure:                Pre-Anesthesia Assessment:                           - Prior to the procedure, a History and Physical                            was performed, and patient medications and                            allergies were reviewed. The patient's tolerance of                            previous anesthesia was also reviewed. The risks                            and benefits of the procedure and the sedation                            options and risks were discussed with the patient.                            All questions were answered, and informed consent  was obtained. Prior Anticoagulants: The patient has                            taken no previous anticoagulant or antiplatelet                            agents. ASA Grade Assessment: II - A patient with                            mild systemic disease. After reviewing the risks                            and benefits, the patient was deemed in      satisfactory condition to undergo the procedure.                           After obtaining informed consent, the colonoscope                            was passed under direct vision. Throughout the                            procedure, the patient's blood pressure, pulse, and                            oxygen saturations were monitored continuously. The                            [id number] scope was introduced through                            the anus and advanced to the the terminal ileum.                            The terminal ileum, ileocecal valve, appendiceal                            orifice, and rectum were photographed. The                            colonoscopy was performed without difficulty. The                            patient tolerated the procedure well. The quality                            of the bowel preparation was adequate. abdominal                            pressure was applied Scope In: 9:13:01 AM Scope Out: 9:31:13 AM Scope Withdrawal Time: 0 hours 12 minutes 13 seconds  Total Procedure Duration: 0 hours 18 minutes 12 seconds  Findings:      Internal hemorrhoids were found during retroflexion, during perianal  exam and during digital exam. The hemorrhoids were small.      A few small-mouthed diverticula were found in the sigmoid colon.      The terminal ileum appeared normal.      The exam was otherwise without abnormality. Impression:               - Internal hemorrhoids.                           - Diverticulosis in the sigmoid colon.                           - The examined portion of the ileum was normal.                           - The examination was otherwise normal.                           - No specimens collected. Moderate Sedation:      moderate sedation-none Recommendation:           - Patient has a contact number available for                            emergencies. The signs and symptoms of potential                             delayed complications were discussed with the                            patient. Return to normal activities tomorrow.                            Written discharge instructions were provided to the                            patient.                           - Soft diet today.                           - Continue present medications.                           - Repeat colonoscopy in 5 years for screening                            purposes.                           - Return to GI office PRN.                           - Telephone GI clinic if symptomatic PRN.                           - Perform an upper  GI endoscopy today. Procedure Code(s):        --- Professional ---                           [phone], Colonoscopy, flexible; diagnostic, including                            collection of specimen(s) by brushing or washing,                            when performed (separate procedure) Diagnosis Code(s):        --- Professional ---                           Z12.11, Encounter for screening for malignant                            neoplasm of colon CPT copyright 2016 [employer]. All rights reserved. The codes documented in this report are preliminary and upon coder review may  be revised to meet current compliance requirements. Clarene Essex, MD 01/27/2016 9:49:11 AM This report has been signed electronically. Number of Addenda: 0

## 2016-01-27 NOTE — Op Note (Signed)
[facility] Patient Name: [name] Procedure Date : 01/27/2016 MRN: [id number] Attending MD: Clarene Essex , MD Date of Birth: 1965/11/24 CSN: [id number] Age: 50 Admit Type: Outpatient Procedure:                Upper GI endoscopy Indications:              Epigastric abdominal pain Providers:                Clarene Essex, MD, Carolynn Comment, RN, Ralene Bathe,                            Technician, Clearnce Sorrel, CRNA Referring MD:              Medicines:                Fentanyl 25 micrograms IV, Ondansetron 4 mg IV,                            Propofol total dose 50 mg IV Complications:            No immediate complications. Estimated Blood Loss:     Estimated blood loss: none. Procedure:                Pre-Anesthesia Assessment:                           - Prior to the procedure, a History and Physical                            was performed, and patient medications and                            allergies were reviewed. The patient's tolerance of                            previous anesthesia was also reviewed. The risks                            and benefits of the procedure and the sedation                            options and risks were discussed with the patient.                            All questions were answered, and informed consent                            was obtained. Prior Anticoagulants: The patient has                            taken no previous anticoagulant or antiplatelet                            agents. ASA Grade Assessment: II - A patient with  mild systemic disease. After reviewing the risks                            and benefits, the patient was deemed in                            satisfactory condition to undergo the procedure.                           - Prior to the procedure, a History and Physical                            was performed, and patient medications and                            allergies were  reviewed. The patient's tolerance of                            previous anesthesia was also reviewed. The risks                            and benefits of the procedure and the sedation                            options and risks were discussed with the patient.                            All questions were answered, and informed consent                            was obtained. Prior Anticoagulants: The patient has                            taken no previous anticoagulant or antiplatelet                            agents. ASA Grade Assessment: II - A patient with                            mild systemic disease. After reviewing the risks                            and benefits, the patient was deemed in                            satisfactory condition to undergo the procedure.                           After obtaining informed consent, the endoscope was                            passed under direct vision. Throughout the  procedure, the patient's blood pressure, pulse, and                            oxygen saturations were monitored continuously. The                            [id number]) scope was introduced through the                            mouth, and advanced to the fourth part of duodenum.                            The upper GI endoscopy was accomplished without                            difficulty. The patient tolerated the procedure                            well. Scope In: Scope Out: Findings:      The larynx was normal.      A small hiatal hernia was present.      The entire examined stomach was normal.      The duodenal bulb, first portion of the duodenum, second portion of the       duodenum, third portion of the duodenum and fourth portion of the       duodenum were normal.      The exam was otherwise without abnormality. Impression:               - Normal larynx.                           - Small hiatal hernia.                            - Normal stomach.                           - Normal duodenal bulb, first portion of the                            duodenum, second portion of the duodenum, third                            portion of the duodenum and fourth portion of the                            duodenum.                           - The examination was otherwise normal.                           - No specimens collected. Moderate Sedation:      moderate sedation-none Recommendation:           - Patient has a contact number available for  emergencies. The signs and symptoms of potential                            delayed complications were discussed with the                            patient. Return to normal activities tomorrow.                            Written discharge instructions were provided to the                            patient.                           - Soft diet today.                           - Continue present medications.                           - Return to GI clinic PRN.                           - Telephone GI clinic if symptomatic PRN.                           - Perform a RUQ ultrasound if symptoms persist. Procedure Code(s):        --- Professional ---                           [phone], Esophagogastroduodenoscopy, flexible,                            transoral; diagnostic, including collection of                            specimen(s) by brushing or washing, when performed                            (separate procedure) Diagnosis Code(s):        --- Professional ---                           K44.9, Diaphragmatic hernia without obstruction or                            gangrene                           R10.13, Epigastric pain CPT copyright 2016 [employer] Association. All rights reserved. The codes documented in this report are preliminary and upon coder review may  be revised to meet current compliance requirements. Clarene Essex, MD 01/27/2016  9:53:17 AM This report has been signed electronically. Number of Addenda: 0

## 2016-01-27 NOTE — Discharge Instructions (Signed)
YOU HAD AN ENDOSCOPIC PROCEDURE TODAY: Refer to the procedure report and other information in the discharge instructions given to you for any specific questions about what was found during the examination. If this information does not answer your questions, please call [facility] office at [phone] to clarify.   YOU SHOULD EXPECT: Some feelings of bloating in the abdomen. Passage of more gas than usual. Walking can help get rid of the air that was put into your GI tract during the procedure and reduce the bloating. If you had a lower endoscopy (such as a colonoscopy or flexible sigmoidoscopy) you may notice spotting of blood in your stool or on the toilet paper. Some abdominal soreness may be present for a day or two, also.  DIET: Your first meal following the procedure should be a light meal and then it is ok to progress to your normal diet. A half-sandwich or bowl of soup is an example of a good first meal. Heavy or fried foods are harder to digest and may make you feel nauseous or bloated. Drink plenty of fluids but you should avoid alcoholic beverages for 24 hours. If you had a esophageal dilation, please see attached instructions for diet.   ACTIVITY: Your care partner should take you home directly after the procedure. You should plan to take it easy, moving slowly for the rest of the day. You can resume normal activity the day after the procedure however YOU SHOULD NOT DRIVE, use power tools, machinery or perform tasks that involve climbing or major physical exertion for 24 hours (because of the sedation medicines used during the test).   SYMPTOMS TO REPORT IMMEDIATELY: A gastroenterologist can be reached at any hour. Please call [phone]  for any of the following symptoms:  Following lower endoscopy (colonoscopy, flexible sigmoidoscopy) Excessive amounts of blood in the stool  Significant tenderness, worsening of abdominal pains  Swelling of the abdomen that is new, acute  Fever of 100 or  higher  Following upper endoscopy (EGD, EUS, ERCP, esophageal dilation) Vomiting of blood or coffee ground material  New, significant abdominal pain  New, significant chest pain or pain under the shoulder blades  Painful or persistently difficult swallowing  New shortness of breath  Black, tarry-looking or red, bloody stools  FOLLOW UP:  If any biopsies were taken you will be contacted by phone or by letter within the next 1-3 weeks. Call [phone]  if you have not heard about the biopsies in 3 weeks.  Please also call with any specific questions about appointments or follow up tests. Call if question or problem otherwise follow-up as needed and recheck colonoscopy in 5 years and consider ultrasound of gallbladder next if pain continues

## 2016-01-27 NOTE — Transfer of Care (Signed)
Immediate Anesthesia Transfer of Care Note  Patient: [name]  Procedure(s) Performed: Procedure(s): ESOPHAGOGASTRODUODENOSCOPY (EGD) WITH PROPOFOL (N/A) COLONOSCOPY WITH PROPOFOL (N/A)  Patient Location: Endoscopy Unit  Anesthesia Type:MAC  Level of Consciousness: awake, alert  and oriented  Airway & Oxygen Therapy: Patient Spontanous Breathing and Patient connected to nasal cannula oxygen  Post-op Assessment: Report given to RN and Post -op Vital signs reviewed and stable  Post vital signs: Reviewed and stable  Last Vitals:  Vitals:   01/27/16 0737  BP: (!) 148/75  Pulse: 61  Resp: 13  Temp: 36.6 C    Last Pain:  Vitals:   01/27/16 0737  TempSrc: Oral         Complications: No apparent anesthesia complications

## 2016-01-28 ENCOUNTER — Encounter (HOSPITAL_COMMUNITY): Payer: Self-pay | Admitting: Gastroenterology

## 2016-02-11 MED FILL — PRAVASTATIN SODIUM 80 MG TA: 80 | 90 days supply | Qty: 90 | Fill #2

## 2016-03-24 ENCOUNTER — Encounter: Payer: 59 | Attending: Family Medicine | Admitting: Skilled Nursing Facility1

## 2016-03-24 DIAGNOSIS — Z713 Dietary counseling and surveillance: Secondary | ICD-10-CM

## 2016-03-25 ENCOUNTER — Encounter: Payer: Self-pay | Admitting: Skilled Nursing Facility1

## 2016-03-25 NOTE — Progress Notes (Signed)
Patient was seen on 11/29/201 for the Weight Loss Class at the [facility]. The following learning objectives were met by the patient during this class:   Describe healthy choices in each food group  Describe portion size of foods  Use plate method for meal planning  Demonstrate how to read Nutrition Facts food label  Set realistic goals for weight loss, diet changes, and physical activity.   Goals:  1. Make healthy food choices in each food group.  2. Reduce portion size of foods.  3. Increase fruit and vegetable intake.  4. Use plate method for meal planning.  5. Increase physical activity.    Handouts given:   1. Nutrition Strategies for Weight Loss   2. Meal plan/portion card   3. MyPlate Planner   4. Weight Management Recipe Resources   5. Bake, Broil, [place]

## 2016-04-12 DIAGNOSIS — H524 Presbyopia: Secondary | ICD-10-CM | POA: Diagnosis not present

## 2016-04-12 MED FILL — XIIDRA 5% EYE DROPS: 5 | 30 days supply | Qty: 60 | Fill #0

## 2016-05-02 DIAGNOSIS — J029 Acute pharyngitis, unspecified: Secondary | ICD-10-CM | POA: Diagnosis not present

## 2016-05-04 MED FILL — FLUTICASONE PROP 50 MCG SPR: 50 | 30 days supply | Qty: 16 | Fill #0

## 2016-05-04 MED FILL — NOREL AD TABLET: 4-10-325 | 6 days supply | Qty: 24 | Fill #0

## 2016-05-06 ENCOUNTER — Telehealth: Payer: 59 | Admitting: Physician Assistant

## 2016-05-06 DIAGNOSIS — J019 Acute sinusitis, unspecified: Secondary | ICD-10-CM | POA: Diagnosis not present

## 2016-05-06 DIAGNOSIS — B9689 Other specified bacterial agents as the cause of diseases classified elsewhere: Secondary | ICD-10-CM

## 2016-05-06 MED ORDER — AMOXICILLIN-POT CLAVULANATE 875-125 MG PO TABS: 1.0000 | ORAL_TABLET | Freq: Two times a day (BID) | ORAL | 0 refills | Status: DC

## 2016-05-06 NOTE — Progress Notes (Signed)

## 2016-05-11 DIAGNOSIS — R509 Fever, unspecified: Secondary | ICD-10-CM | POA: Diagnosis not present

## 2016-05-11 DIAGNOSIS — J019 Acute sinusitis, unspecified: Secondary | ICD-10-CM | POA: Diagnosis not present

## 2016-05-11 DIAGNOSIS — B9689 Other specified bacterial agents as the cause of diseases classified elsewhere: Secondary | ICD-10-CM | POA: Diagnosis not present

## 2016-06-09 MED FILL — XIIDRA 5% EYE DROPS: 5 | 30 days supply | Qty: 60 | Fill #1

## 2016-06-09 MED FILL — ARMODAFINIL 150 MG TABLET: 150 | 30 days supply | Qty: 30 | Fill #1

## 2016-06-30 MED FILL — PRAVASTATIN SODIUM 80 MG TA: 80 | 90 days supply | Qty: 90 | Fill #0

## 2016-07-07 MED FILL — ARMODAFINIL 150 MG TABLET: 150 | 30 days supply | Qty: 30 | Fill #2

## 2016-08-13 DIAGNOSIS — Z1389 Encounter for screening for other disorder: Secondary | ICD-10-CM | POA: Diagnosis not present

## 2016-08-13 DIAGNOSIS — Z01419 Encounter for gynecological examination (general) (routine) without abnormal findings: Secondary | ICD-10-CM | POA: Diagnosis not present

## 2016-08-13 DIAGNOSIS — Z6828 Body mass index (BMI) 28.0-28.9, adult: Secondary | ICD-10-CM | POA: Diagnosis not present

## 2016-08-13 MED FILL — ARMODAFINIL 150 MG TABLET: 150 | 30 days supply | Qty: 30 | Fill #0

## 2016-08-13 MED FILL — ESOMEPRAZOLE MAG DR 40 MG C: 40 | 90 days supply | Qty: 90 | Fill #0

## 2016-08-13 MED FILL — metroNIDAZOLE 250 MG TABS: 250 | 7 days supply | Qty: 21 | Fill #0

## 2016-09-03 MED FILL — XIIDRA 5% EYE DROPS: 5 | 30 days supply | Qty: 60 | Fill #2

## 2016-11-18 MED FILL — ESOMEPRAZOLE MAG DR 40 MG C: 40 | 90 days supply | Qty: 90 | Fill #1

## 2016-11-18 MED FILL — XIIDRA 5% EYE DROPS: 5 | 30 days supply | Qty: 60 | Fill #3

## 2016-11-18 MED FILL — PRAVASTATIN SODIUM 80 MG TA: 80 | 90 days supply | Qty: 90 | Fill #1

## 2016-11-23 DIAGNOSIS — Z1231 Encounter for screening mammogram for malignant neoplasm of breast: Secondary | ICD-10-CM | POA: Diagnosis not present

## 2016-11-30 DIAGNOSIS — M25552 Pain in left hip: Secondary | ICD-10-CM | POA: Diagnosis not present

## 2016-11-30 DIAGNOSIS — M545 Low back pain: Secondary | ICD-10-CM | POA: Diagnosis not present

## 2016-12-08 DIAGNOSIS — M25552 Pain in left hip: Secondary | ICD-10-CM | POA: Diagnosis not present

## 2016-12-08 DIAGNOSIS — M545 Low back pain: Secondary | ICD-10-CM | POA: Diagnosis not present

## 2016-12-15 DIAGNOSIS — M25552 Pain in left hip: Secondary | ICD-10-CM | POA: Diagnosis not present

## 2016-12-15 DIAGNOSIS — M545 Low back pain: Secondary | ICD-10-CM | POA: Diagnosis not present

## 2016-12-23 DIAGNOSIS — M25552 Pain in left hip: Secondary | ICD-10-CM | POA: Diagnosis not present

## 2016-12-23 DIAGNOSIS — M545 Low back pain: Secondary | ICD-10-CM | POA: Diagnosis not present

## 2016-12-24 DIAGNOSIS — L820 Inflamed seborrheic keratosis: Secondary | ICD-10-CM | POA: Diagnosis not present

## 2017-01-06 DIAGNOSIS — M545 Low back pain: Secondary | ICD-10-CM | POA: Diagnosis not present

## 2017-01-06 DIAGNOSIS — M25552 Pain in left hip: Secondary | ICD-10-CM | POA: Diagnosis not present

## 2017-01-28 MED FILL — XIIDRA 5% EYE DROPS: 5 | 30 days supply | Qty: 60 | Fill #0

## 2017-04-07 MED FILL — XIIDRA 5% EYE DROPS: 5 | 30 days supply | Qty: 60 | Fill #1

## 2017-04-14 DIAGNOSIS — H5203 Hypermetropia, bilateral: Secondary | ICD-10-CM | POA: Diagnosis not present

## 2017-04-14 DIAGNOSIS — H524 Presbyopia: Secondary | ICD-10-CM | POA: Diagnosis not present

## 2017-04-14 DIAGNOSIS — H52223 Regular astigmatism, bilateral: Secondary | ICD-10-CM | POA: Diagnosis not present

## 2017-05-03 MED FILL — PRAVASTATIN SODIUM 80 MG TA: 80 | 90 days supply | Qty: 90 | Fill #2

## 2017-05-03 MED FILL — ESOMEPRAZOLE MAG DR 40 MG C: 40 | 90 days supply | Qty: 90 | Fill #2

## 2017-06-15 MED FILL — ARMODAFINIL 150 MG TABLET: 150 | 90 days supply | Qty: 90 | Fill #0

## 2017-06-16 DIAGNOSIS — K219 Gastro-esophageal reflux disease without esophagitis: Secondary | ICD-10-CM | POA: Insufficient documentation

## 2017-06-16 DIAGNOSIS — E782 Mixed hyperlipidemia: Secondary | ICD-10-CM | POA: Insufficient documentation

## 2017-06-16 MED FILL — XIIDRA 5% EYE DROPS: 5 | 30 days supply | Qty: 60 | Fill #2

## 2017-08-22 MED FILL — XIIDRA 5% EYE DROPS: 5 | 30 days supply | Qty: 60 | Fill #3

## 2017-10-03 MED FILL — ESOMEPRAZOLE MAG DR 40 MG C: 40 | 90 days supply | Qty: 90 | Fill #0

## 2017-10-06 MED FILL — XIIDRA 5% EYE DROPS: 5 | 30 days supply | Qty: 60 | Fill #4

## 2017-10-24 ENCOUNTER — Encounter: Payer: Self-pay | Admitting: Family Medicine

## 2017-10-24 ENCOUNTER — Ambulatory Visit (INDEPENDENT_AMBULATORY_CARE_PROVIDER_SITE_OTHER): Payer: Self-pay | Admitting: Family Medicine

## 2017-10-24 VITALS — BP 130/94 | HR 63 | Ht 64.0 in | Wt 167.0 lb

## 2017-10-24 DIAGNOSIS — Z0000 Encounter for general adult medical examination without abnormal findings: Secondary | ICD-10-CM

## 2017-10-24 LAB — POCT URINALYSIS DIPSTICK
Bilirubin, UA: NEGATIVE
Blood, UA: NEGATIVE
Glucose, UA: NEGATIVE
Ketones, UA: NEGATIVE
Leukocytes, UA: NEGATIVE
Nitrite, UA: NEGATIVE
Protein, UA: NEGATIVE
Spec Grav, UA: 1.015 (ref 1.010–1.025)
Urobilinogen, UA: 0.2 E.U./dL
pH, UA: 5.5 (ref 5.0–8.0)

## 2017-10-24 NOTE — Addendum Note (Signed)
Addended by: Vassie Loll D on: 10/24/2017 09:51 AM   Modules accepted: Orders

## 2017-10-24 NOTE — Patient Instructions (Signed)

## 2017-10-24 NOTE — Progress Notes (Signed)
Patient ID: [name], female    DOB: 09-14-1965, 52 y.o.   MRN: [id number]  PCP: Shannon Broker, MD  Chief Complaint  Patient presents with  . focus-wellness exam    Subjective:  HPI [name] is a 52 y.o. female presents for wellness visit in order to satisfy employee sponsored health insurance benefits. No recent follow-up with a primary care provider. She currently has a primary care provider which is out of network for her current insurance plan and she is scheduled to have a complete physical with him in January once she changed her insurance plan. Reports good overall health. Exercises daily and makes efforts to eat a healthy diet. Current Body mass index is 28.67 kg/m. Denies shortness of breath , chest pain, dizziness, or new weakness. Social History   Socioeconomic History  . Marital status: Single    Spouse name: Not on file  . Number of children: Not on file  . Years of education: Not on file  . Highest education level: Not on file  Occupational History  . Not on file  Social Needs  . Financial resource strain: Not on file  . Food insecurity:    Worry: Not on file    Inability: Not on file  . Transportation needs:    Medical: Not on file    Non-medical: Not on file  Tobacco Use  . Smoking status: Never Smoker  . Smokeless tobacco: Never Used  Substance and Sexual Activity  . Alcohol use: No  . Drug use: No  . Sexual activity: Not on file  Lifestyle  . Physical activity:    Days per week: Not on file    Minutes per session: Not on file  . Stress: Not on file  Relationships  . Social connections:    Talks on phone: Not on file    Gets together: Not on file    Attends religious service: Not on file    Active member of club or organization: Not on file    Attends meetings of clubs or organizations: Not on file    Relationship status: Not on file  . Intimate partner violence:    Fear of current or ex partner: Not on file    Emotionally abused:  Not on file    Physically abused: Not on file    Forced sexual activity: Not on file  Other Topics Concern  . Not on file  Social History Narrative  . Not on file    Family History  Problem Relation Age of Onset  . Hypertension Other   . Hyperlipidemia Other   . Diabetes Mellitus II Mother   . Sleep apnea Mother   . Hypertension Mother   . High Cholesterol Mother   . Hypertension Father   . High Cholesterol Father     Review of Systems Constitutional: Negative for fever, chills, diaphoresis, activity change, appetite change and fatigue. HENT: Negative for ear pain, nosebleeds, congestion, facial swelling, rhinorrhea, neck pain, neck stiffness and ear discharge.  Eyes: Negative for pain, discharge, redness, itching and visual disturbance. Respiratory: Negative for cough, choking, chest tightness, shortness of breath, wheezing and stridor.  Cardiovascular: Negative for chest pain, palpitations and leg swelling. Gastrointestinal: Negative for abdominal distention. History of hiatal hernia and chronic bloating. Musculoskeletal: Negative for back pain, joint swelling, arthralgia and gait problem. Neurological: Negative for dizziness, tremors, seizures, syncope, facial asymmetry, speech difficulty, weakness, light-headedness, numbness and headaches.  Patient Active Problem List   Diagnosis Date  Noted  . Lumbar back pain 04/24/2013  . Cervicalgia 03/13/2013  . Myofascial pain on right side 03/13/2013  . Tendinopathy of right rotator cuff 03/13/2013  . Hypercholesterolemia 09/21/2011    Allergies  Allergen Reactions  . Codeine     Prior to Admission medications   Medication Sig Start Date End Date Taking? Authorizing Provider  Armodafinil 150 MG tablet Take by mouth. 06/15/17 06/15/18 Yes [provider]  fish oil-omega-3 fatty acids 1000 MG capsule Take 100 mg by mouth 2 (two) times daily.   Yes [provider]  Lifitegrast (XIIDRA) 5 % SOLN INSTILL 1 DROP INTO  BOTH EYES TWICE DAILY AS DIRECTED 04/07/17  Yes [provider]  pravastatin (PRAVACHOL) 80 MG tablet Take 80 mg by mouth every evening.    Yes [provider]  Tetrahydrozoline HCl (EYE DROPS OP) Apply to eye. For dry eyes, uses 1 drop in each eye twice a day   Yes [provider]  calcium-vitamin D (OSCAL WITH D) 500-200 MG-UNIT per tablet Take 1 tablet by mouth 2 (two) times daily.    [provider]  co-enzyme Q-10 30 MG capsule Take 100 mg by mouth 2 (two) times daily.    [provider]  meloxicam (MOBIC) 15 MG tablet Take 1 tablet (15 mg total) by mouth daily. Patient not taking: Reported on 10/24/2017 03/13/13   Meredith Staggers, MD  methocarbamol (ROBAXIN) 500 MG tablet Take 1 tablet (500 mg total) by mouth every 6 (six) hours as needed for muscle spasms. Patient not taking: Reported on 10/24/2017 07/17/13   Meredith Staggers, MD   Past Medical, Surgical Family and Social History reviewed and updated.   Objective:   Today's Vitals   10/24/17 0834  BP: (!) 130/94  Pulse: 63  SpO2: 98%  Weight: 167 lb (75.8 kg)  Height: 5\' 4"  (1.626 m)    Wt Readings from Last 3 Encounters:  10/24/17 167 lb (75.8 kg)  01/27/16 160 lb (72.6 kg)  07/17/13 166 lb (75.3 kg)   Physical Exam Constitutional: Patient appears well-developed and well-nourished. No distress. HENT: Normocephalic, atraumatic, External right and left ear normal. Oropharynx is clear and moist.  Eyes: Conjunctivae and EOM are normal. PERRLA, no scleral icterus. Neck: Normal ROM. Neck supple. No JVD. No tracheal deviation. No thyromegaly. CVS: RRR, S1/S2 +, no murmurs, no gallops, no carotid bruit.  Pulmonary: Effort and breath sounds normal, no stridor, rhonchi, wheezes, rales.  Abdominal: Soft. BS +, no distension, tenderness, rebound or guarding.  Musculoskeletal: Normal range of motion. No edema and no tenderness.  Neuro: Alert. Normal reflexes, muscle tone coordination.  Skin:  Skin is warm and dry. No rash noted. Not diaphoretic. No erythema. No pallor. Psychiatric: Normal mood and affect. Behavior, judgment, thought content normal.  Assessment & Plan:  1. Wellness examination Age-appropriate anticipatory guidance provided. Continue engaging in physical activity as tolerated with goal of 150 minutes per week. Improve dietary choices and eat a meal regimen consistent with a Mediterranean or DASH diet.   Do not skip meals and eat healthy snacks throughout the day to avoid over-eating at dinner. Set a goal weight loss that is achievable for you. Follow-up with PCP to obtain routine screening labs with your complete physical scheduled for January. If symptoms worsen or do not improve, return for follow-up, follow-up with PCP, or at the emergency department if severity of symptoms warrant a higher level of care.   Carroll Sage. Kenton Kingfisher, MSN, FNP-C [employer]  [address], [place] [address] [phone]

## 2017-12-16 MED FILL — XIIDRA 5% EYE DROPS: 5 | 30 days supply | Qty: 60 | Fill #5

## 2018-01-05 MED FILL — PRAVASTATIN SODIUM 80 MG TA: 80 | 90 days supply | Qty: 90 | Fill #0

## 2018-02-21 MED FILL — ESOMEPRAZOLE MAG DR 40 MG C: 40 | 90 days supply | Qty: 90 | Fill #1

## 2018-02-28 MED FILL — XIIDRA 5% EYE DROPS: 5 | 30 days supply | Qty: 60 | Fill #0

## 2018-05-01 MED FILL — XIIDRA 5% EYE DROPS: 5 | 30 days supply | Qty: 60 | Fill #1

## 2018-05-03 DIAGNOSIS — H5203 Hypermetropia, bilateral: Secondary | ICD-10-CM | POA: Diagnosis not present

## 2018-05-03 DIAGNOSIS — H524 Presbyopia: Secondary | ICD-10-CM | POA: Diagnosis not present

## 2018-05-03 DIAGNOSIS — H52223 Regular astigmatism, bilateral: Secondary | ICD-10-CM | POA: Diagnosis not present

## 2018-05-11 DIAGNOSIS — R82998 Other abnormal findings in urine: Secondary | ICD-10-CM | POA: Diagnosis not present

## 2018-05-11 DIAGNOSIS — G47419 Narcolepsy without cataplexy: Secondary | ICD-10-CM | POA: Diagnosis not present

## 2018-05-11 DIAGNOSIS — Z0000 Encounter for general adult medical examination without abnormal findings: Secondary | ICD-10-CM | POA: Diagnosis not present

## 2018-05-11 DIAGNOSIS — K219 Gastro-esophageal reflux disease without esophagitis: Secondary | ICD-10-CM | POA: Diagnosis not present

## 2018-05-11 DIAGNOSIS — E782 Mixed hyperlipidemia: Secondary | ICD-10-CM | POA: Diagnosis not present

## 2018-05-11 MED FILL — PRAVASTATIN SODIUM 80 MG TA: 80 | 90 days supply | Qty: 90 | Fill #0

## 2018-05-11 MED FILL — ARMODAFINIL 150 MG TABLET: 150 | 90 days supply | Qty: 90 | Fill #0

## 2018-05-11 MED FILL — ESOMEPRAZOLE MAG DR 40 MG C: 40 | 90 days supply | Qty: 90 | Fill #0

## 2018-06-22 DIAGNOSIS — Z1231 Encounter for screening mammogram for malignant neoplasm of breast: Secondary | ICD-10-CM | POA: Diagnosis not present

## 2018-06-24 DIAGNOSIS — M25552 Pain in left hip: Secondary | ICD-10-CM | POA: Diagnosis not present

## 2018-06-24 DIAGNOSIS — M79605 Pain in left leg: Secondary | ICD-10-CM | POA: Diagnosis not present

## 2018-06-24 DIAGNOSIS — M545 Low back pain: Secondary | ICD-10-CM | POA: Diagnosis not present

## 2018-06-28 MED FILL — XIIDRA 5% EYE DROPS: 5 | 30 days supply | Qty: 60 | Fill #2 | Status: TO

## 2018-07-01 DIAGNOSIS — M25552 Pain in left hip: Secondary | ICD-10-CM | POA: Diagnosis not present

## 2018-07-01 DIAGNOSIS — M79605 Pain in left leg: Secondary | ICD-10-CM | POA: Diagnosis not present

## 2018-07-01 DIAGNOSIS — M545 Low back pain: Secondary | ICD-10-CM | POA: Diagnosis not present

## 2018-07-06 DIAGNOSIS — M79605 Pain in left leg: Secondary | ICD-10-CM | POA: Diagnosis not present

## 2018-07-06 DIAGNOSIS — M545 Low back pain: Secondary | ICD-10-CM | POA: Diagnosis not present

## 2018-07-06 DIAGNOSIS — M25552 Pain in left hip: Secondary | ICD-10-CM | POA: Diagnosis not present

## 2018-08-28 MED FILL — XIIDRA 5% EYE DROPS: 5 | 30 days supply | Qty: 60 | Fill #0

## 2018-10-12 DIAGNOSIS — M7052 Other bursitis of knee, left knee: Secondary | ICD-10-CM | POA: Diagnosis not present

## 2018-11-03 MED FILL — PRAVASTATIN SODIUM 80 MG TA: 80 | 90 days supply | Qty: 90 | Fill #1

## 2018-11-03 MED FILL — XIIDRA 5% EYE DROPS: 5 | 30 days supply | Qty: 60 | Fill #0

## 2018-11-06 DIAGNOSIS — M7989 Other specified soft tissue disorders: Secondary | ICD-10-CM | POA: Diagnosis not present

## 2018-11-06 DIAGNOSIS — M25562 Pain in left knee: Secondary | ICD-10-CM | POA: Diagnosis not present

## 2018-11-06 DIAGNOSIS — M79672 Pain in left foot: Secondary | ICD-10-CM | POA: Diagnosis not present

## 2018-11-09 DIAGNOSIS — M2352 Chronic instability of knee, left knee: Secondary | ICD-10-CM | POA: Insufficient documentation

## 2018-11-13 ENCOUNTER — Other Ambulatory Visit: Payer: Self-pay | Admitting: Orthopedic Surgery

## 2018-11-13 DIAGNOSIS — M2352 Chronic instability of knee, left knee: Secondary | ICD-10-CM

## 2018-11-15 ENCOUNTER — Ambulatory Visit (HOSPITAL_COMMUNITY): Payer: 59

## 2018-11-20 ENCOUNTER — Other Ambulatory Visit: Payer: Self-pay

## 2018-11-20 ENCOUNTER — Ambulatory Visit (HOSPITAL_COMMUNITY)
Admission: RE | Admit: 2018-11-20 | Discharge: 2018-11-20 | Disposition: A | Discharge status: Home / Self Care | Payer: 59 | Source: Ambulatory Visit | Attending: Orthopedic Surgery | Admitting: Orthopedic Surgery

## 2018-11-20 DIAGNOSIS — M2352 Chronic instability of knee, left knee: Secondary | ICD-10-CM | POA: Diagnosis not present

## 2018-11-20 DIAGNOSIS — M25562 Pain in left knee: Secondary | ICD-10-CM | POA: Diagnosis not present

## 2018-11-28 DIAGNOSIS — M2352 Chronic instability of knee, left knee: Secondary | ICD-10-CM | POA: Diagnosis not present

## 2018-12-26 MED FILL — ESOMEPRAZOLE MAG DR 40 MG C: 40 | 90 days supply | Qty: 90 | Fill #1

## 2018-12-26 MED FILL — XIIDRA 5% EYE DROPS: 5 | 30 days supply | Qty: 60 | Fill #1

## 2019-01-10 DIAGNOSIS — M542 Cervicalgia: Secondary | ICD-10-CM | POA: Diagnosis not present

## 2019-01-10 DIAGNOSIS — M79672 Pain in left foot: Secondary | ICD-10-CM | POA: Diagnosis not present

## 2019-01-10 DIAGNOSIS — M9903 Segmental and somatic dysfunction of lumbar region: Secondary | ICD-10-CM | POA: Diagnosis not present

## 2019-01-10 DIAGNOSIS — M9906 Segmental and somatic dysfunction of lower extremity: Secondary | ICD-10-CM | POA: Diagnosis not present

## 2019-01-10 DIAGNOSIS — M722 Plantar fascial fibromatosis: Secondary | ICD-10-CM | POA: Diagnosis not present

## 2019-01-10 DIAGNOSIS — M9901 Segmental and somatic dysfunction of cervical region: Secondary | ICD-10-CM | POA: Diagnosis not present

## 2019-01-10 DIAGNOSIS — M62830 Muscle spasm of back: Secondary | ICD-10-CM | POA: Diagnosis not present

## 2019-01-10 DIAGNOSIS — M9905 Segmental and somatic dysfunction of pelvic region: Secondary | ICD-10-CM | POA: Diagnosis not present

## 2019-01-10 DIAGNOSIS — M545 Low back pain: Secondary | ICD-10-CM | POA: Diagnosis not present

## 2019-01-12 DIAGNOSIS — M542 Cervicalgia: Secondary | ICD-10-CM | POA: Diagnosis not present

## 2019-01-12 DIAGNOSIS — M79672 Pain in left foot: Secondary | ICD-10-CM | POA: Diagnosis not present

## 2019-01-12 DIAGNOSIS — M545 Low back pain: Secondary | ICD-10-CM | POA: Diagnosis not present

## 2019-01-12 DIAGNOSIS — M9906 Segmental and somatic dysfunction of lower extremity: Secondary | ICD-10-CM | POA: Diagnosis not present

## 2019-01-12 DIAGNOSIS — M9901 Segmental and somatic dysfunction of cervical region: Secondary | ICD-10-CM | POA: Diagnosis not present

## 2019-01-12 DIAGNOSIS — M9905 Segmental and somatic dysfunction of pelvic region: Secondary | ICD-10-CM | POA: Diagnosis not present

## 2019-01-12 DIAGNOSIS — M722 Plantar fascial fibromatosis: Secondary | ICD-10-CM | POA: Diagnosis not present

## 2019-01-12 DIAGNOSIS — M9903 Segmental and somatic dysfunction of lumbar region: Secondary | ICD-10-CM | POA: Diagnosis not present

## 2019-01-12 DIAGNOSIS — M62830 Muscle spasm of back: Secondary | ICD-10-CM | POA: Diagnosis not present

## 2019-01-24 DIAGNOSIS — M542 Cervicalgia: Secondary | ICD-10-CM | POA: Diagnosis not present

## 2019-01-24 DIAGNOSIS — M9901 Segmental and somatic dysfunction of cervical region: Secondary | ICD-10-CM | POA: Diagnosis not present

## 2019-01-24 DIAGNOSIS — M9906 Segmental and somatic dysfunction of lower extremity: Secondary | ICD-10-CM | POA: Diagnosis not present

## 2019-01-24 DIAGNOSIS — M62830 Muscle spasm of back: Secondary | ICD-10-CM | POA: Diagnosis not present

## 2019-01-24 DIAGNOSIS — M545 Low back pain: Secondary | ICD-10-CM | POA: Diagnosis not present

## 2019-01-24 DIAGNOSIS — M722 Plantar fascial fibromatosis: Secondary | ICD-10-CM | POA: Diagnosis not present

## 2019-01-24 DIAGNOSIS — M9903 Segmental and somatic dysfunction of lumbar region: Secondary | ICD-10-CM | POA: Diagnosis not present

## 2019-01-24 DIAGNOSIS — M9905 Segmental and somatic dysfunction of pelvic region: Secondary | ICD-10-CM | POA: Diagnosis not present

## 2019-01-24 DIAGNOSIS — M79672 Pain in left foot: Secondary | ICD-10-CM | POA: Diagnosis not present

## 2019-02-07 DIAGNOSIS — M79672 Pain in left foot: Secondary | ICD-10-CM | POA: Diagnosis not present

## 2019-02-07 DIAGNOSIS — M62830 Muscle spasm of back: Secondary | ICD-10-CM | POA: Diagnosis not present

## 2019-02-07 DIAGNOSIS — M722 Plantar fascial fibromatosis: Secondary | ICD-10-CM | POA: Diagnosis not present

## 2019-02-07 DIAGNOSIS — M545 Low back pain: Secondary | ICD-10-CM | POA: Diagnosis not present

## 2019-02-07 DIAGNOSIS — M9903 Segmental and somatic dysfunction of lumbar region: Secondary | ICD-10-CM | POA: Diagnosis not present

## 2019-02-07 DIAGNOSIS — M9905 Segmental and somatic dysfunction of pelvic region: Secondary | ICD-10-CM | POA: Diagnosis not present

## 2019-02-07 DIAGNOSIS — M9901 Segmental and somatic dysfunction of cervical region: Secondary | ICD-10-CM | POA: Diagnosis not present

## 2019-02-07 DIAGNOSIS — M542 Cervicalgia: Secondary | ICD-10-CM | POA: Diagnosis not present

## 2019-02-07 DIAGNOSIS — M9906 Segmental and somatic dysfunction of lower extremity: Secondary | ICD-10-CM | POA: Diagnosis not present

## 2019-02-21 DIAGNOSIS — M79672 Pain in left foot: Secondary | ICD-10-CM | POA: Diagnosis not present

## 2019-02-21 DIAGNOSIS — M9903 Segmental and somatic dysfunction of lumbar region: Secondary | ICD-10-CM | POA: Diagnosis not present

## 2019-02-21 DIAGNOSIS — M542 Cervicalgia: Secondary | ICD-10-CM | POA: Diagnosis not present

## 2019-02-21 DIAGNOSIS — M62830 Muscle spasm of back: Secondary | ICD-10-CM | POA: Diagnosis not present

## 2019-02-21 DIAGNOSIS — M9901 Segmental and somatic dysfunction of cervical region: Secondary | ICD-10-CM | POA: Diagnosis not present

## 2019-02-21 DIAGNOSIS — M9906 Segmental and somatic dysfunction of lower extremity: Secondary | ICD-10-CM | POA: Diagnosis not present

## 2019-02-21 DIAGNOSIS — M9905 Segmental and somatic dysfunction of pelvic region: Secondary | ICD-10-CM | POA: Diagnosis not present

## 2019-02-21 DIAGNOSIS — M722 Plantar fascial fibromatosis: Secondary | ICD-10-CM | POA: Diagnosis not present

## 2019-02-21 DIAGNOSIS — M545 Low back pain: Secondary | ICD-10-CM | POA: Diagnosis not present

## 2019-02-22 MED FILL — XIIDRA 5% EYE DROPS: 5 | 30 days supply | Qty: 60 | Fill #0

## 2019-03-07 DIAGNOSIS — M722 Plantar fascial fibromatosis: Secondary | ICD-10-CM | POA: Diagnosis not present

## 2019-03-07 DIAGNOSIS — M9901 Segmental and somatic dysfunction of cervical region: Secondary | ICD-10-CM | POA: Diagnosis not present

## 2019-03-07 DIAGNOSIS — M545 Low back pain: Secondary | ICD-10-CM | POA: Diagnosis not present

## 2019-03-07 DIAGNOSIS — M9906 Segmental and somatic dysfunction of lower extremity: Secondary | ICD-10-CM | POA: Diagnosis not present

## 2019-03-07 DIAGNOSIS — M79672 Pain in left foot: Secondary | ICD-10-CM | POA: Diagnosis not present

## 2019-03-07 DIAGNOSIS — M542 Cervicalgia: Secondary | ICD-10-CM | POA: Diagnosis not present

## 2019-03-07 DIAGNOSIS — M9905 Segmental and somatic dysfunction of pelvic region: Secondary | ICD-10-CM | POA: Diagnosis not present

## 2019-03-07 DIAGNOSIS — M9903 Segmental and somatic dysfunction of lumbar region: Secondary | ICD-10-CM | POA: Diagnosis not present

## 2019-03-07 DIAGNOSIS — M62830 Muscle spasm of back: Secondary | ICD-10-CM | POA: Diagnosis not present

## 2019-04-06 MED FILL — PRAVASTATIN SODIUM 80 MG TA: 80 | 90 days supply | Qty: 90 | Fill #2

## 2019-04-23 MED FILL — XIIDRA 5 % SOLN: 5 | 30 days supply | Qty: 60 | Fill #0

## 2019-06-15 MED FILL — XIIDRA 5 % SOLN: 5 | 30 days supply | Qty: 60 | Fill #1

## 2019-07-09 MED FILL — XIIDRA 5 % SOLN: 5 | 30 days supply | Qty: 60 | Fill #1

## 2019-07-19 ENCOUNTER — Other Ambulatory Visit (HOSPITAL_COMMUNITY): Payer: Self-pay | Admitting: Family Medicine

## 2019-07-19 DIAGNOSIS — K219 Gastro-esophageal reflux disease without esophagitis: Secondary | ICD-10-CM | POA: Diagnosis not present

## 2019-07-19 DIAGNOSIS — Z1211 Encounter for screening for malignant neoplasm of colon: Secondary | ICD-10-CM | POA: Diagnosis not present

## 2019-07-19 DIAGNOSIS — Z0000 Encounter for general adult medical examination without abnormal findings: Secondary | ICD-10-CM | POA: Diagnosis not present

## 2019-07-19 DIAGNOSIS — E782 Mixed hyperlipidemia: Secondary | ICD-10-CM | POA: Diagnosis not present

## 2019-07-19 DIAGNOSIS — Z112 Encounter for screening for other bacterial diseases: Secondary | ICD-10-CM | POA: Diagnosis not present

## 2019-07-19 MED FILL — ARMODAFINIL 150 MG TABLET: 150 | 90 days supply | Qty: 90 | Fill #0

## 2019-07-19 MED FILL — buPROPion HCL ER (XL) 150 M: 150 | 90 days supply | Qty: 90 | Fill #0

## 2019-07-19 MED FILL — PRAVASTATIN SODIUM 80 MG TA: 80 | 90 days supply | Qty: 90 | Fill #0

## 2019-07-19 MED FILL — ESOMEPRAZOLE MAG DR 40 MG C: 40 | 90 days supply | Qty: 90 | Fill #0

## 2019-08-27 DIAGNOSIS — Z1231 Encounter for screening mammogram for malignant neoplasm of breast: Secondary | ICD-10-CM | POA: Diagnosis not present

## 2019-08-28 MED FILL — XIIDRA 5 % SOLN: 5 | 30 days supply | Qty: 60 | Fill #2

## 2019-08-29 DIAGNOSIS — M25562 Pain in left knee: Secondary | ICD-10-CM | POA: Diagnosis not present

## 2019-09-05 ENCOUNTER — Ambulatory Visit: Payer: 59 | Admitting: Sports Medicine

## 2019-09-11 DIAGNOSIS — M7052 Other bursitis of knee, left knee: Secondary | ICD-10-CM | POA: Diagnosis not present

## 2019-09-13 ENCOUNTER — Other Ambulatory Visit: Payer: Self-pay

## 2019-09-13 ENCOUNTER — Ambulatory Visit: Payer: 59 | Admitting: Sports Medicine

## 2019-09-13 ENCOUNTER — Other Ambulatory Visit: Payer: Self-pay | Admitting: Sports Medicine

## 2019-09-13 ENCOUNTER — Encounter: Payer: Self-pay | Admitting: Sports Medicine

## 2019-09-13 DIAGNOSIS — Z86018 Personal history of other benign neoplasm: Secondary | ICD-10-CM

## 2019-09-13 DIAGNOSIS — M2550 Pain in unspecified joint: Secondary | ICD-10-CM | POA: Diagnosis not present

## 2019-09-13 DIAGNOSIS — M79672 Pain in left foot: Secondary | ICD-10-CM

## 2019-09-13 DIAGNOSIS — M792 Neuralgia and neuritis, unspecified: Secondary | ICD-10-CM | POA: Diagnosis not present

## 2019-09-13 DIAGNOSIS — Q742 Other congenital malformations of lower limb(s), including pelvic girdle: Secondary | ICD-10-CM | POA: Diagnosis not present

## 2019-09-13 DIAGNOSIS — M79674 Pain in right toe(s): Secondary | ICD-10-CM

## 2019-09-13 DIAGNOSIS — M79675 Pain in left toe(s): Secondary | ICD-10-CM | POA: Diagnosis not present

## 2019-09-13 NOTE — Progress Notes (Signed)
Subjective: Iman A Bordner is a 54 y.o. female patient who presents to office for evaluation of left greater than right foot pain. Patient complains of progressive pain especially over the last several weeks that started after going hiking where there is numbness to the left greater than the right second toe with tingling reports that she has always had issues with tingling in the foot and had a previous history of neuroma which was injected and was very painful that she had to be on crutches after the injection due to the reaction that she had to the medication. Ranks pain 4/10 and is now interferring with daily activities. Patient has tried changing shoes with no relief in symptoms. Patient denies any other pedal complaints. Denies injury/trip/fall/sprain/any other causative factors.   Patient admits to multiple joint pain including knee and lower back and also had a issue with her rotator cuff on the right  Review of Systems  All other systems reviewed and are negative.    Patient Active Problem List   Diagnosis Date Noted  . Annual physical exam 07/19/2019  . Recurrent left knee instability 11/09/2018  . GERD without esophagitis 06/16/2017  . Mixed dyslipidemia 06/16/2017  . Lumbar back pain 04/24/2013  . Cervicalgia 03/13/2013  . Myofascial pain on right side 03/13/2013  . Tendinopathy of right rotator cuff 03/13/2013  . Hypercholesterolemia 09/21/2011    Current Outpatient Medications on File Prior to Visit  Medication Sig Dispense Refill  . Armodafinil 150 MG tablet Take by mouth.    . calcium-vitamin D (OSCAL WITH D) 500-200 MG-UNIT per tablet Take 1 tablet by mouth 2 (two) times daily.    . fish oil-omega-3 fatty acids 1000 MG capsule Take 100 mg by mouth 2 (two) times daily.    . Lifitegrast (XIIDRA) 5 % SOLN INSTILL 1 DROP INTO BOTH EYES TWICE DAILY AS DIRECTED    . pravastatin (PRAVACHOL) 80 MG tablet Take 80 mg by mouth every evening.      No current facility-administered  medications on file prior to visit.    Allergies  Allergen Reactions  . Codeine     Objective:  General: Alert and oriented x3 in no acute distress  Dermatology: No open lesions bilateral lower extremities, no webspace macerations, no ecchymosis bilateral, all nails x 10 are well manicured.  Vascular: Dorsalis Pedis and Posterior Tibial pedal pulses palpable, Capillary Fill Time 3 seconds,(+) pedal hair growth bilateral, no edema bilateral lower extremities, Temperature gradient within normal limits.  Neurology: Gross sensation intact via light touch bilateral, subjective tingling left greater than right foot and numbness only to the left second toe.  Positive Mulder sign on left. (- )Tinels sign bilateral.   Musculoskeletal: Mild tenderness with palpation at left second toe.  Bilateral second toes are the longest toe structurally.  Mild bunion bilateral.  Pes planus foot type.  Gait: Antalgic gait   Assessment and Plan: Problem List Items Addressed This Visit    None    Visit Diagnoses    Arthralgia, unspecified joint    -  Primary   Relevant Orders   Uric acid   Sedimentation rate   C-reactive protein   Rheumatoid factor   ANA, IFA Comprehensive Panel   HLA-B27 antigen   CBC with Differential/Platelet   Vitamin B12   Neuritis       Long toe       Toe pain, bilateral       History of neuroma           -  Complete examination performed -Patient declined x-rays at this time -Discussed treatment options for possible neuritis, arthralgia, mechanical foot pain secondary to long toe -Rx arthritic panel -Recommend good supportive shoes daily and dispensed toe caps to avoid rubbing or irritation to second toes -Advised over-the-counter pain cream as needed to the affected area as well as rest ice elevation -Patient to return to office after blood work or sooner if condition worsens.  Advised patient if negative may benefit from a further work-up for  neuritis/neuropathy  Landis Martins, DPM

## 2019-09-14 DIAGNOSIS — M069 Rheumatoid arthritis, unspecified: Secondary | ICD-10-CM | POA: Diagnosis not present

## 2019-09-20 LAB — CBC WITH DIFFERENTIAL/PLATELET
Basophils Absolute: 0.1 10*3/uL (ref 0.0–0.2)
Basos: 1 %
EOS (ABSOLUTE): 0.1 10*3/uL (ref 0.0–0.4)
Eos: 1 %
Hematocrit: 42.3 % (ref 34.0–46.6)
Hemoglobin: 14.7 g/dL (ref 11.1–15.9)
Immature Grans (Abs): 0 10*3/uL (ref 0.0–0.1)
Immature Granulocytes: 0 %
Lymphocytes Absolute: 1.8 10*3/uL (ref 0.7–3.1)
Lymphs: 28 %
MCH: 33.9 pg — ABNORMAL HIGH (ref 26.6–33.0)
MCHC: 34.8 g/dL (ref 31.5–35.7)
MCV: 98 fL — ABNORMAL HIGH (ref 79–97)
Monocytes Absolute: 0.5 10*3/uL (ref 0.1–0.9)
Monocytes: 8 %
Neutrophils Absolute: 4.1 10*3/uL (ref 1.4–7.0)
Neutrophils: 62 %
Platelets: 297 10*3/uL (ref 150–450)
RBC: 4.33 x10E6/uL (ref 3.77–5.28)
RDW: 11.9 % (ref 11.7–15.4)
WBC: 6.6 10*3/uL (ref 3.4–10.8)

## 2019-09-20 LAB — HLA-B27 ANTIGEN: HLA B27: NEGATIVE

## 2019-09-20 LAB — URIC ACID: Uric Acid: 5.6 mg/dL (ref 3.0–7.2)

## 2019-09-20 LAB — SEDIMENTATION RATE: Sed Rate: 6 mm/hr (ref 0–40)

## 2019-09-20 LAB — RHEUMATOID FACTOR: Rhuematoid fact SerPl-aCnc: <10 IU/mL (ref 0.0–13.9)

## 2019-09-20 LAB — ANTINUCLEAR ANTIBODIES, IFA: ANA Titer 1: NEGATIVE

## 2019-09-20 LAB — VITAMIN B12: Vitamin B-12: 447 pg/mL (ref 232–1245)

## 2019-09-20 LAB — C-REACTIVE PROTEIN: CRP: 1 mg/L (ref 0–10)

## 2019-10-02 DIAGNOSIS — M7052 Other bursitis of knee, left knee: Secondary | ICD-10-CM | POA: Diagnosis not present

## 2019-10-15 DIAGNOSIS — M7052 Other bursitis of knee, left knee: Secondary | ICD-10-CM | POA: Diagnosis not present

## 2019-10-19 MED FILL — XIIDRA 5 % SOLN: 5 | 30 days supply | Qty: 60 | Fill #3

## 2019-10-19 MED FILL — ARMODAFINIL 150 MG TABLET: 150 | 90 days supply | Qty: 90 | Fill #1

## 2019-10-22 DIAGNOSIS — M7052 Other bursitis of knee, left knee: Secondary | ICD-10-CM | POA: Diagnosis not present

## 2019-11-01 DIAGNOSIS — H524 Presbyopia: Secondary | ICD-10-CM | POA: Diagnosis not present

## 2019-11-01 DIAGNOSIS — H5203 Hypermetropia, bilateral: Secondary | ICD-10-CM | POA: Diagnosis not present

## 2019-11-01 DIAGNOSIS — M7052 Other bursitis of knee, left knee: Secondary | ICD-10-CM | POA: Diagnosis not present

## 2019-11-12 DIAGNOSIS — M7052 Other bursitis of knee, left knee: Secondary | ICD-10-CM | POA: Diagnosis not present

## 2019-11-27 DIAGNOSIS — M7052 Other bursitis of knee, left knee: Secondary | ICD-10-CM | POA: Diagnosis not present

## 2019-12-10 MED FILL — ESOMEPRAZOLE MAG DR 40 MG C: 40 | 90 days supply | Qty: 90 | Fill #1

## 2019-12-17 DIAGNOSIS — K219 Gastro-esophageal reflux disease without esophagitis: Secondary | ICD-10-CM | POA: Diagnosis not present

## 2019-12-17 DIAGNOSIS — J3501 Chronic tonsillitis: Secondary | ICD-10-CM | POA: Diagnosis not present

## 2019-12-17 DIAGNOSIS — R0989 Other specified symptoms and signs involving the circulatory and respiratory systems: Secondary | ICD-10-CM | POA: Diagnosis not present

## 2019-12-20 ENCOUNTER — Other Ambulatory Visit (HOSPITAL_COMMUNITY): Payer: Self-pay | Admitting: Optometrist

## 2019-12-20 MED FILL — XIIDRA 5 % SOLN: 5 | 30 days supply | Qty: 60 | Fill #0

## 2019-12-25 DIAGNOSIS — M7052 Other bursitis of knee, left knee: Secondary | ICD-10-CM | POA: Diagnosis not present

## 2020-02-13 MED FILL — XIIDRA 5 % SOLN: 5 | 30 days supply | Qty: 60 | Fill #1

## 2020-03-14 DIAGNOSIS — M7052 Other bursitis of knee, left knee: Secondary | ICD-10-CM | POA: Diagnosis not present

## 2020-03-28 MED FILL — PRAVASTATIN SODIUM 80 MG TA: 80 | 90 days supply | Qty: 90 | Fill #1

## 2020-03-28 MED FILL — ESOMEPRAZOLE MAG DR 40 MG C: 40 | 90 days supply | Qty: 90 | Fill #2

## 2020-04-14 MED FILL — XIIDRA 5 % SOLN: 5 | 30 days supply | Qty: 60 | Fill #2

## 2020-05-22 DIAGNOSIS — M545 Low back pain, unspecified: Secondary | ICD-10-CM | POA: Diagnosis not present

## 2020-05-26 DIAGNOSIS — M545 Low back pain, unspecified: Secondary | ICD-10-CM | POA: Diagnosis not present

## 2020-05-28 ENCOUNTER — Ambulatory Visit (HOSPITAL_COMMUNITY)
Admission: EM | Admit: 2020-05-28 | Discharge: 2020-05-28 | Disposition: A | Discharge status: Home / Self Care | Payer: 59 | Attending: Family Medicine | Admitting: Family Medicine

## 2020-05-28 ENCOUNTER — Other Ambulatory Visit: Payer: Self-pay

## 2020-05-28 ENCOUNTER — Encounter (HOSPITAL_COMMUNITY): Payer: Self-pay | Admitting: Emergency Medicine

## 2020-05-28 ENCOUNTER — Other Ambulatory Visit (HOSPITAL_COMMUNITY): Payer: Self-pay | Admitting: Family Medicine

## 2020-05-28 DIAGNOSIS — S39012A Strain of muscle, fascia and tendon of lower back, initial encounter: Secondary | ICD-10-CM | POA: Insufficient documentation

## 2020-05-28 DIAGNOSIS — R3129 Other microscopic hematuria: Secondary | ICD-10-CM | POA: Diagnosis not present

## 2020-05-28 DIAGNOSIS — M545 Low back pain, unspecified: Secondary | ICD-10-CM

## 2020-05-28 LAB — POCT URINALYSIS DIPSTICK, ED / UC
Bilirubin Urine: NEGATIVE
Glucose, UA: NEGATIVE mg/dL
Ketones, ur: NEGATIVE mg/dL
Nitrite: NEGATIVE
Protein, ur: NEGATIVE mg/dL
Specific Gravity, Urine: 1.01 (ref 1.005–1.030)
Urobilinogen, UA: 0.2 mg/dL (ref 0.0–1.0)
pH: 5.5 (ref 5.0–8.0)

## 2020-05-28 MED ORDER — CYCLOBENZAPRINE HCL 10 MG PO TABS: 10.0000 mg | ORAL_TABLET | Freq: Three times a day (TID) | ORAL | 0 refills | Status: DC | PRN

## 2020-05-28 MED ORDER — PREDNISONE 20 MG PO TABS: 40.0000 mg | ORAL_TABLET | Freq: Every day | ORAL | 0 refills | Status: DC

## 2020-05-28 MED FILL — CYCLOBENZAPRINE HCL 10 MG T: 10 | 5 days supply | Qty: 15 | Fill #0

## 2020-05-28 MED FILL — predniSONE 20 MG TABS: 20 | 5 days supply | Qty: 10 | Fill #0

## 2020-05-28 NOTE — ED Triage Notes (Signed)
Pt presents with pain to right lower back pain that radiates to right hip and down RLE x 3 days. Denies injury.

## 2020-05-29 LAB — URINE CULTURE: Culture: <10000 — AB

## 2020-05-29 NOTE — ED Provider Notes (Signed)
[place]    CSN: [id number] Arrival date & time: 05/28/20  0809      History   Chief Complaint Chief Complaint  Patient presents with  . Back Pain    right lower    HPI [name] is a 55 y.o. female.   Here today with 1 week history of right lower back/side pain that radiates down into right lateral hip. Denies known injury though does stand all day at work and is very physically active. Pain is worse with movement and lifting, somewhat relieved by rest. Denies hematuria, dysuria, numbness, tingling, bowel or bladder incontinence, leg swelling or weakness, hx of back injuries. Trying PT, stretches, OTC pain relievers, heat with minimal relief. Sxs have improved mildly since onset.      Past Medical History:  Diagnosis Date  . Arthritis   . Gestational diabetes   . Headache   . Hyperlipidemia   . PONV (postoperative nausea and vomiting)   . Seasonal allergies   . Seizure ([place])    after taking sudafed - never proven    Patient Active Problem List   Diagnosis Date Noted  . Annual physical exam 07/19/2019  . Recurrent left knee instability 11/09/2018  . GERD without esophagitis 06/16/2017  . Mixed dyslipidemia 06/16/2017  . Lumbar back pain 04/24/2013  . Cervicalgia 03/13/2013  . Myofascial pain on right side 03/13/2013  . Tendinopathy of right rotator cuff 03/13/2013  . Hypercholesterolemia 09/21/2011    Past Surgical History:  Procedure Laterality Date  . ABDOMINAL HYSTERECTOMY    . BREAST SURGERY  1991   breast implants   . COLONOSCOPY    . COLONOSCOPY WITH PROPOFOL N/A 01/27/2016   Procedure: COLONOSCOPY WITH PROPOFOL;  Surgeon: Clarene Essex, MD;  Location[facility] END[facility]COPY;  Service: Endoscopy;  Laterality: N/A;  . ESOPHAGOGASTRODUODENOSCOPY (EGD) WITH PROPOFOL N/A 01/27/2016   Procedure: ESOPHAGOGASTRODUODENOSCOPY (EGD) WITH PROPOFOL;  Surgeon: Clarene Essex, MD;  Location: [facility] ENDOSCOPY;  Service: Endoscopy;  Laterality: N/A;  . LIPOSUCTION    .  reverse tummy tuck      OB History   No obstetric history on file.      Home Medications    Prior to Admission medications   Medication Sig Start Date End Date Taking? Authorizing Provider  cyclobenzaprine (FLEXERIL) 10 MG tablet Take 1 tablet (10 mg total) by mouth 3 (three) times daily as needed for muscle spasms. DO NOT DRINK ALCOHOL OR DRIVE WHILE TAKING THIS MEDICATION 05/28/20  Yes Volney American, PA-C  predniSONE (DELTASONE) 20 MG tablet Take 2 tablets (40 mg total) by mouth daily with breakfast. 05/28/20  Yes Volney American, PA-C  Armodafinil 150 MG tablet Take by mouth. 06/15/17 06/15/18  [provider]  calcium-vitamin D (OSCAL WITH D) 500-200 MG-UNIT per tablet Take 1 tablet by mouth 2 (two) times daily.    [provider]  esomeprazole (NEXIUM) 40 MG capsule Take by mouth. 07/19/19   [provider]  famotidine (PEPCID) 20 MG tablet  02/11/19   [provider]  fish oil-omega-3 fatty acids 1000 MG capsule Take 100 mg by mouth 2 (two) times daily.    [provider]  fluticasone Asencion Islam) 50 MCG/ACT nasal spray  07/12/18   [provider]  Lifitegrast (Shirley Friar) 5 % SOLN INSTILL 1 DROP INTO BOTH EYES TWICE DAILY AS DIRECTED 04/07/17   [provider]  loratadine (CLARITIN) 10 MG tablet  07/12/18   [provider]  pravastatin (PRAVACHOL) 80 MG tablet  Take 80 mg by mouth every evening.     [provider]    Family History Family History  Problem Relation Age of Onset  . Hypertension Other   . Hyperlipidemia Other   . Diabetes Mellitus II Mother   . Sleep apnea Mother   . Hypertension Mother   . High Cholesterol Mother   . Hypertension Father   . High Cholesterol Father     Social History Social History   Tobacco Use  . Smoking status: Never Smoker  . Smokeless tobacco: Never Used  Substance Use Topics  . Alcohol use: No  . Drug use: No     Allergies    Codeine   Review of Systems Review of Systems PER HPI   Physical Exam Triage Vital Signs ED Triage Vitals  Enc Vitals Group     BP 05/28/20 0833 (!) 174/81     Pulse Rate 05/28/20 0833 (!) 57     Resp 05/28/20 0833 16     Temp 05/28/20 0834 98.3 F (36.8 C)     Temp Source 05/28/20 0834 Oral     SpO2 05/28/20 0833 100 %     Weight 05/28/20 0835 165 lb (74.8 kg)     Height 05/28/20 0835 5\' 4"  (1.626 m)     Head Circumference --      Peak Flow --      Pain Score 05/28/20 0832 4     Pain Loc --      Pain Edu? --      Excl. in [place]? --    No data found.  Updated Vital Signs BP (!) 174/81 (BP Location: Left Arm)   Pulse (!) 57   Temp 98.3 F (36.8 C) (Oral)   Resp 16   Ht 5\' 4"  (1.626 m)   Wt 165 lb (74.8 kg)   LMP 05/28/2010   SpO2 100%   BMI 28.32 kg/m   Visual Acuity Right Eye Distance:   Left Eye Distance:   Bilateral Distance:    Right Eye Near:   Left Eye Near:    Bilateral Near:     Physical Exam Vitals and nursing note reviewed.  Constitutional:      Appearance: Normal appearance. She is not ill-appearing.  HENT:     Head: Atraumatic.  Eyes:     Extraocular Movements: Extraocular movements intact.     Conjunctiva/sclera: Conjunctivae normal.  Cardiovascular:     Rate and Rhythm: Normal rate and regular rhythm.     Heart sounds: Normal heart sounds.  Pulmonary:     Effort: Pulmonary effort is normal.     Breath sounds: Normal breath sounds.  Abdominal:     General: Bowel sounds are normal. There is no distension.     Palpations: Abdomen is soft.     Tenderness: There is no abdominal tenderness. There is no right CVA tenderness, left CVA tenderness or guarding.  Musculoskeletal:        General: No swelling, tenderness (no significant ttp right low back/flank), deformity or signs of injury. Normal range of motion.     Cervical back: Normal range of motion and neck supple.  Skin:    General: Skin is warm and dry.     Findings: No bruising,  erythema or rash.  Neurological:     Mental Status: She is alert and oriented to person, place, and time.     Sensory: No sensory deficit.     Motor: No weakness.  Gait: Gait normal.  Psychiatric:        Mood and Affect: Mood normal.        Thought Content: Thought content normal.        Judgment: Judgment normal.     UC Treatments / Results  Labs (all labs ordered are listed, but only abnormal results are displayed) Labs Reviewed  POCT URINALYSIS DIPSTICK, ED / UC - Abnormal; Notable for the following components:      Result Value   Hgb urine dipstick TRACE (*)    Leukocytes,Ua TRACE (*)    All other components within normal limits  URINE CULTURE    EKG   Radiology No results found.  Procedures Procedures (including critical care time)  Medications Ordered in UC Medications - No data to display  Initial Impression / Assessment and Plan / UC Course  I have reviewed the triage vital signs and the nursing notes.  Pertinent labs & imaging results that were available during my care of the patient were reviewed by me and considered in my medical decision making (see chart for details).     Sxs more consistent with muscular strain than kidney stone or intra abdominal process, though U/A did show blood and trace leuks today. WIll send for urine culture, push fluids, and treat for muscular pain with prednisone burst and flexeril. Continue supportive home care. Return for acutely worsening sxs.   Final Clinical Impressions(s) / UC Diagnoses   Final diagnoses:  Strain of lumbar region, initial encounter  Other microscopic hematuria   Discharge Instructions   None    ED Prescriptions    Medication Sig Dispense Auth. Provider   predniSONE (DELTASONE) 20 MG tablet Take 2 tablets (40 mg total) by mouth daily with breakfast. 10 tablet Volney American, PA-C   cyclobenzaprine (FLEXERIL) 10 MG tablet Take 1 tablet (10 mg total) by mouth 3 (three) times daily as  needed for muscle spasms. DO NOT DRINK ALCOHOL OR DRIVE WHILE TAKING THIS MEDICATION 15 tablet Volney American, [place]     PDMP not reviewed this encounter.   Merrie Roof Parker, [place] 05/29/20 [phone]

## 2020-06-03 MED FILL — XIIDRA 5 % SOLN: 5 | 30 days supply | Qty: 60 | Fill #3

## 2020-06-20 MED FILL — ESOMEPRAZOLE MAG DR 40 MG C: 40 | 90 days supply | Qty: 90 | Fill #3

## 2020-06-20 MED FILL — PRAVASTATIN SODIUM 80 MG TA: 80 | 90 days supply | Qty: 90 | Fill #2

## 2020-07-11 DIAGNOSIS — M545 Low back pain, unspecified: Secondary | ICD-10-CM | POA: Diagnosis not present

## 2020-07-18 DIAGNOSIS — M545 Low back pain, unspecified: Secondary | ICD-10-CM | POA: Diagnosis not present

## 2020-07-21 MED FILL — XIIDRA 5 % SOLN: 5 | 30 days supply | Qty: 60 | Fill #4

## 2020-09-16 ENCOUNTER — Other Ambulatory Visit (HOSPITAL_COMMUNITY): Payer: Self-pay

## 2020-09-16 MED FILL — Lifitegrast Ophth Soln 5%: OPHTHALMIC | 30 days supply | Qty: 60 | Fill #0 | Status: CN

## 2020-09-24 ENCOUNTER — Other Ambulatory Visit (HOSPITAL_COMMUNITY): Payer: Self-pay

## 2020-09-26 ENCOUNTER — Other Ambulatory Visit (HOSPITAL_COMMUNITY): Payer: Self-pay

## 2020-09-26 MED FILL — Lifitegrast Ophth Soln 5%: OPHTHALMIC | 30 days supply | Qty: 60 | Fill #0 | Status: AC

## 2020-10-01 DIAGNOSIS — M25561 Pain in right knee: Secondary | ICD-10-CM | POA: Diagnosis not present

## 2020-10-02 ENCOUNTER — Other Ambulatory Visit (HOSPITAL_COMMUNITY): Payer: Self-pay

## 2020-10-02 DIAGNOSIS — M545 Low back pain, unspecified: Secondary | ICD-10-CM | POA: Diagnosis not present

## 2020-10-02 MED ORDER — PREDNISONE 10 MG (21) PO TBPK
ORAL_TABLET | ORAL | 0 refills | Status: DC
  Filled 2020-10-02: qty 21, 6d supply, fill #0

## 2020-10-13 DIAGNOSIS — L814 Other melanin hyperpigmentation: Secondary | ICD-10-CM | POA: Diagnosis not present

## 2020-10-13 DIAGNOSIS — L738 Other specified follicular disorders: Secondary | ICD-10-CM | POA: Diagnosis not present

## 2020-10-13 DIAGNOSIS — L821 Other seborrheic keratosis: Secondary | ICD-10-CM | POA: Diagnosis not present

## 2020-10-15 ENCOUNTER — Other Ambulatory Visit (HOSPITAL_COMMUNITY): Payer: Self-pay

## 2020-10-15 DIAGNOSIS — Z23 Encounter for immunization: Secondary | ICD-10-CM | POA: Diagnosis not present

## 2020-10-15 DIAGNOSIS — Z0000 Encounter for general adult medical examination without abnormal findings: Secondary | ICD-10-CM | POA: Diagnosis not present

## 2020-10-15 DIAGNOSIS — Z8249 Family history of ischemic heart disease and other diseases of the circulatory system: Secondary | ICD-10-CM | POA: Diagnosis not present

## 2020-10-15 DIAGNOSIS — E782 Mixed hyperlipidemia: Secondary | ICD-10-CM | POA: Diagnosis not present

## 2020-10-15 DIAGNOSIS — K219 Gastro-esophageal reflux disease without esophagitis: Secondary | ICD-10-CM | POA: Diagnosis not present

## 2020-10-15 DIAGNOSIS — Z83438 Family history of other disorder of lipoprotein metabolism and other lipidemia: Secondary | ICD-10-CM | POA: Diagnosis not present

## 2020-10-15 DIAGNOSIS — Z79899 Other long term (current) drug therapy: Secondary | ICD-10-CM | POA: Diagnosis not present

## 2020-10-15 MED ORDER — ARMODAFINIL 150 MG PO TABS
150.0000 mg | ORAL_TABLET | Freq: Every day | ORAL | 1 refills | Status: DC
  Filled 2020-10-15: qty 90, 90d supply, fill #0
  Filled 2021-02-09: qty 90, 90d supply, fill #1

## 2020-10-15 MED ORDER — PRAVASTATIN SODIUM 80 MG PO TABS
ORAL_TABLET | ORAL | 3 refills | Status: DC
  Filled 2020-10-15: qty 90, 90d supply, fill #0
  Filled 2021-05-29: qty 90, 90d supply, fill #1

## 2020-10-15 MED ORDER — SHINGRIX 50 MCG/0.5ML IM SUSR
0.5000 mL | INTRAMUSCULAR | 1 refills | Status: DC
  Filled 2020-10-15 (×2): qty 0.5, 1d supply, fill #0
  Filled 2020-12-22 – 2021-01-08 (×2): qty 0.5, 1d supply, fill #1

## 2020-10-15 MED ORDER — MELOXICAM 15 MG PO TABS
1.0000 | ORAL_TABLET | Freq: Every day | ORAL | 0 refills | Status: DC
  Filled 2020-10-15: qty 90, 90d supply, fill #0

## 2020-10-15 MED ORDER — ESOMEPRAZOLE MAGNESIUM 40 MG PO CPDR
40.0000 mg | DELAYED_RELEASE_CAPSULE | Freq: Every day | ORAL | 3 refills | Status: DC
  Filled 2020-10-15: qty 90, 90d supply, fill #0
  Filled 2021-02-23: qty 90, 90d supply, fill #1
  Filled 2021-07-01: qty 90, 90d supply, fill #2

## 2020-10-17 ENCOUNTER — Other Ambulatory Visit (HOSPITAL_COMMUNITY): Payer: Self-pay

## 2020-10-20 ENCOUNTER — Other Ambulatory Visit (HOSPITAL_COMMUNITY): Payer: Self-pay

## 2020-11-22 IMAGING — MR MRI OF THE LEFT KNEE WITHOUT CONTRAST
4 of 7 series · 19 of 40 positions shown · non-contrast
Comparison: None.

CLINICAL DATA: Left knee pain with recurrent left knee instability.

EXAM:
MRI OF THE LEFT KNEE WITHOUT CONTRAST
TECHNIQUE: Multiplanar, multisequence MR imaging of the knee was performed. No
intravenous contrast was administered.

[Series 2: T2 fat-sat · axial · 4.0mm · 0.31mm/px · z∈[-76,+54]mm · 3 of 27 slices shown]
[im 1/27]
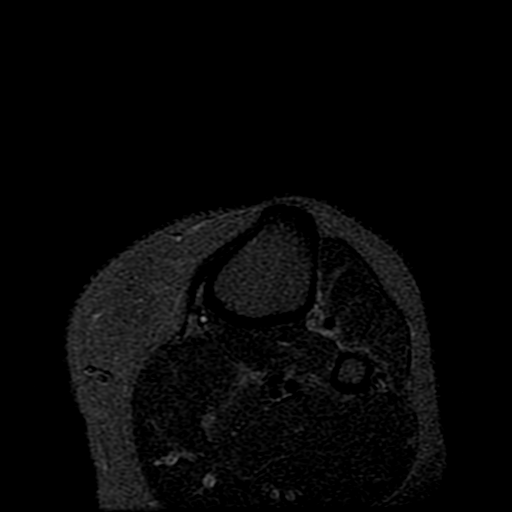
[im 14/27]
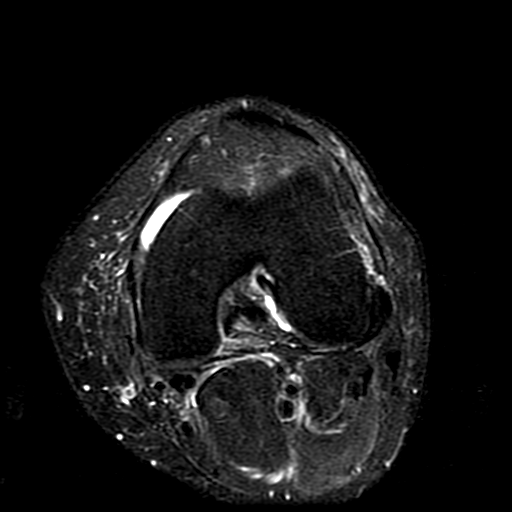
[im 27/27]
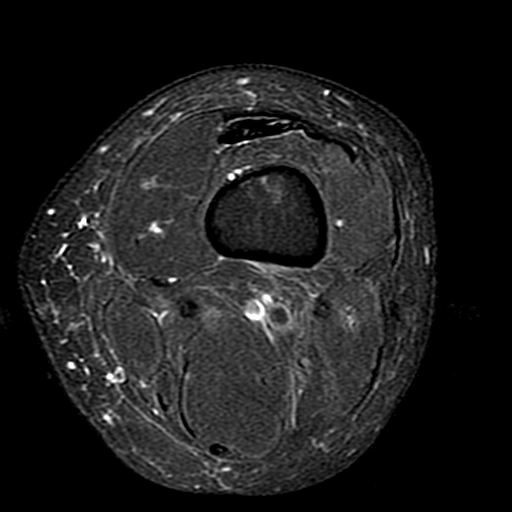

[Series 4: PD fat-sat · coronal · 4.0mm · 0.31mm/px · 6 of 29 slices shown (1 of 3)]
[im 1/29]
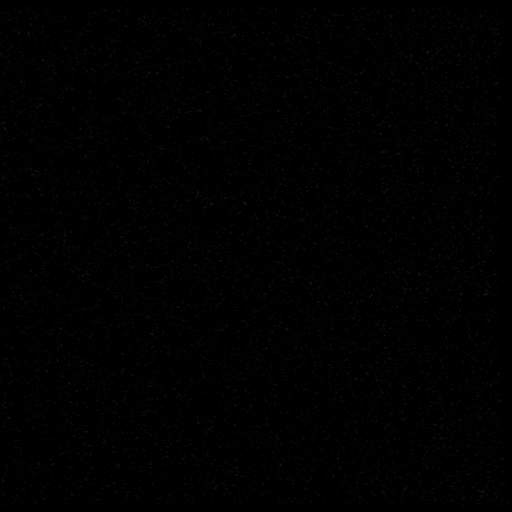
[im 6/29]
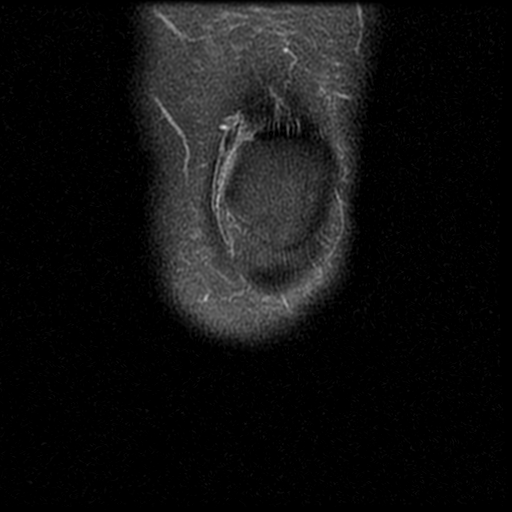
[im 12/29]
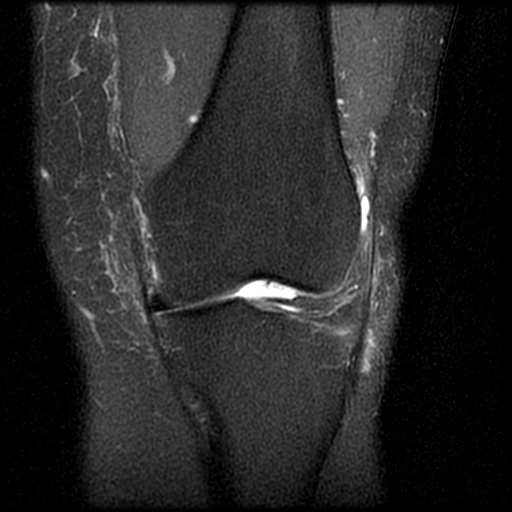
[im 17/29]
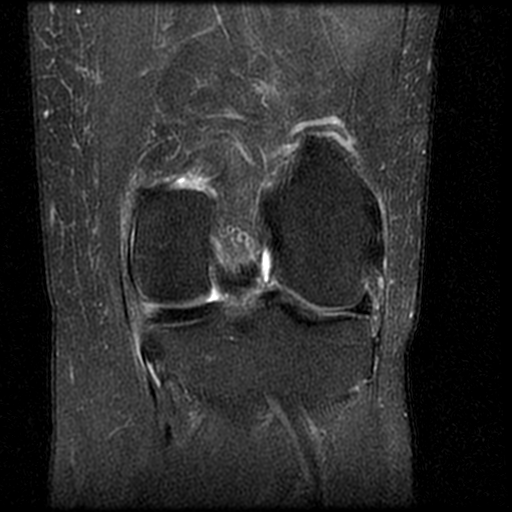
[im 23/29]
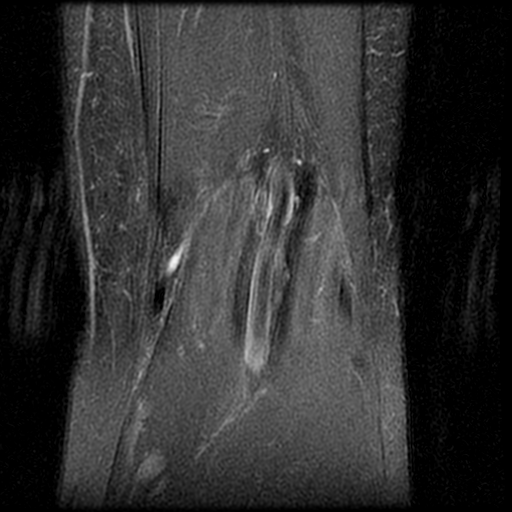
[im 29/29]
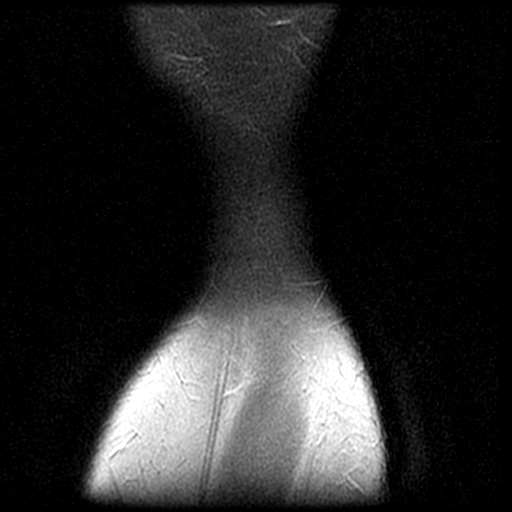

[Series 6: PD fat-sat · sagittal · 3.0mm · 0.31mm/px · 7 of 32 slices shown (2 of 3)]
[im 1/32]
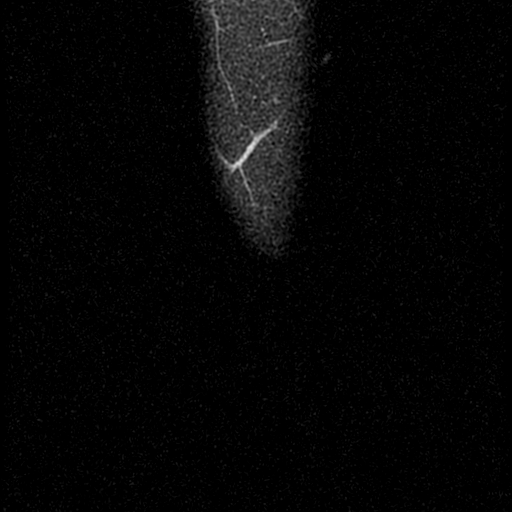
[im 6/32]
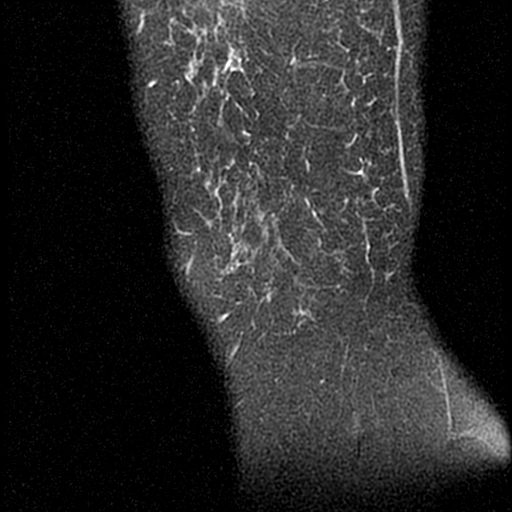
[im 11/32]
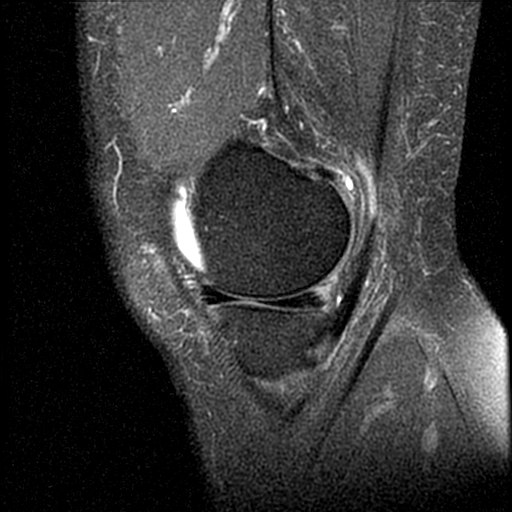
[im 16/32]
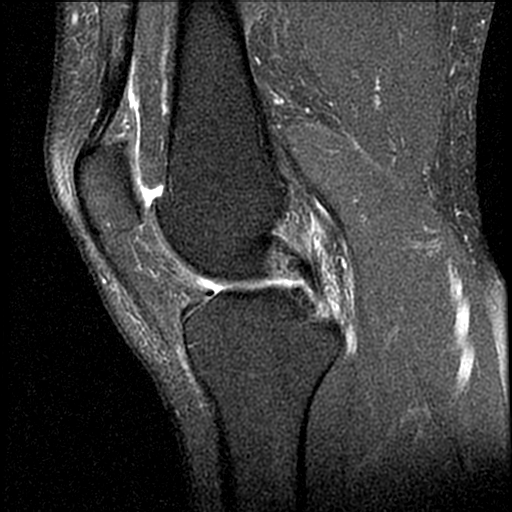
[im 21/32]
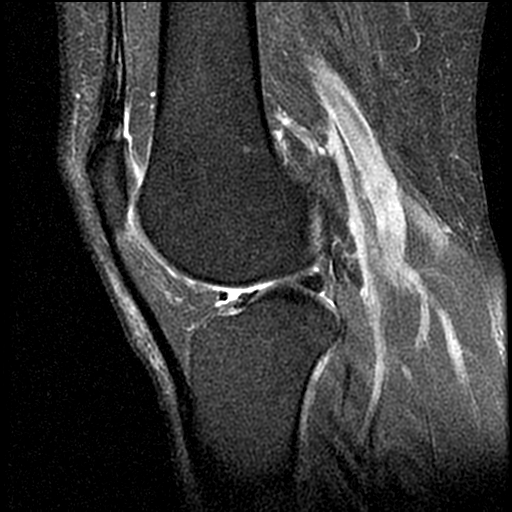
[im 26/32]
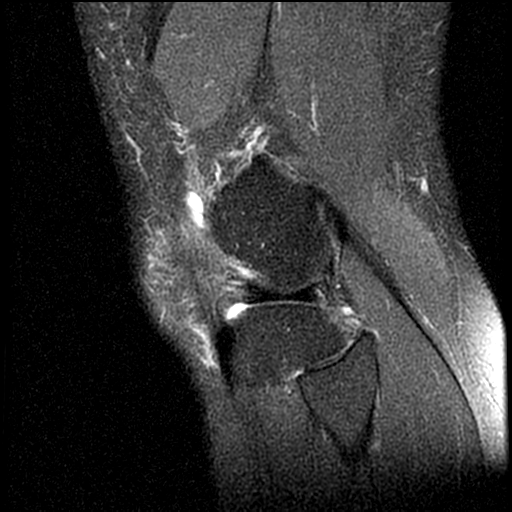
[im 32/32]
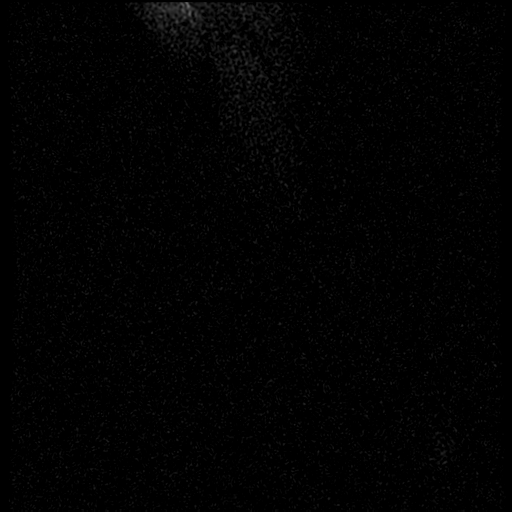

[Series 8: PD fat-sat · coronal · 2.0mm · 0.31mm/px · 3 of 12 slices shown (3 of 3)]
[im 1/12]
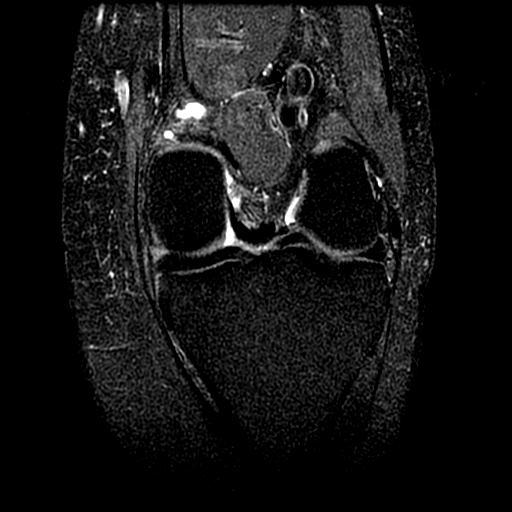
[im 6/12]
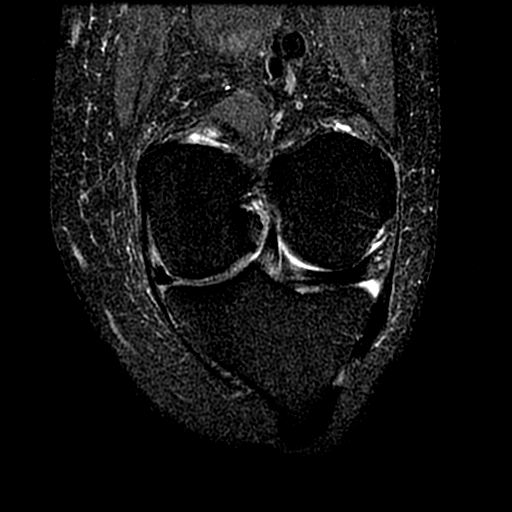
[im 12/12]
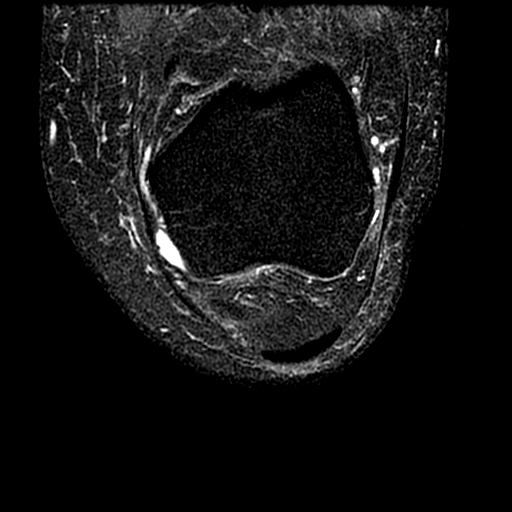

[19 of 40 positions shown; findings below may reference images not displayed]

FINDINGS: MENISCI

Medial meniscus:  Normal.

Lateral meniscus:  Normal.

LIGAMENTS

Cruciates:  Normal.

Collaterals:  Normal.

CARTILAGE

Patellofemoral:  Normal.

Medial:  Normal.

Lateral:  Normal.

Joint: Trace joint effusion. Normal Hoffa's fat pad. Plical
thickening.

Popliteal Fossa:  Tiny Baker's cyst.  Intact popliteus tendon.

Extensor Mechanism:  Normal.

Bones:  Normal.

Other: None
IMPRESSION: 1. No discrete internal derangement of left knee.
2. Nonspecific trace joint effusion with tiny Baker's cyst.

## 2020-11-25 ENCOUNTER — Other Ambulatory Visit (HOSPITAL_COMMUNITY): Payer: Self-pay

## 2020-11-27 ENCOUNTER — Other Ambulatory Visit (HOSPITAL_COMMUNITY): Payer: Self-pay

## 2020-11-28 ENCOUNTER — Other Ambulatory Visit (HOSPITAL_COMMUNITY): Payer: Self-pay

## 2020-12-01 ENCOUNTER — Other Ambulatory Visit (HOSPITAL_COMMUNITY): Payer: Self-pay

## 2020-12-02 ENCOUNTER — Other Ambulatory Visit (HOSPITAL_COMMUNITY): Payer: Self-pay

## 2020-12-05 ENCOUNTER — Other Ambulatory Visit (HOSPITAL_COMMUNITY): Payer: Self-pay

## 2020-12-05 MED ORDER — XIIDRA 5 % OP SOLN
1.0000 [drp] | Freq: Two times a day (BID) | OPHTHALMIC | 5 refills | Status: DC
  Filled 2020-12-05: qty 180, 90d supply, fill #0
  Filled 2021-05-13: qty 180, 90d supply, fill #1

## 2020-12-22 ENCOUNTER — Other Ambulatory Visit (HOSPITAL_COMMUNITY): Payer: Self-pay

## 2020-12-23 DIAGNOSIS — Z1231 Encounter for screening mammogram for malignant neoplasm of breast: Secondary | ICD-10-CM | POA: Diagnosis not present

## 2021-01-08 ENCOUNTER — Other Ambulatory Visit (HOSPITAL_COMMUNITY): Payer: Self-pay

## 2021-02-10 ENCOUNTER — Other Ambulatory Visit (HOSPITAL_COMMUNITY): Payer: Self-pay

## 2021-02-24 ENCOUNTER — Other Ambulatory Visit (HOSPITAL_COMMUNITY): Payer: Self-pay

## 2021-05-04 ENCOUNTER — Other Ambulatory Visit (HOSPITAL_COMMUNITY): Payer: Self-pay

## 2021-05-04 DIAGNOSIS — L918 Other hypertrophic disorders of the skin: Secondary | ICD-10-CM | POA: Diagnosis not present

## 2021-05-04 DIAGNOSIS — L738 Other specified follicular disorders: Secondary | ICD-10-CM | POA: Diagnosis not present

## 2021-05-04 DIAGNOSIS — L814 Other melanin hyperpigmentation: Secondary | ICD-10-CM | POA: Diagnosis not present

## 2021-05-04 DIAGNOSIS — D1801 Hemangioma of skin and subcutaneous tissue: Secondary | ICD-10-CM | POA: Diagnosis not present

## 2021-05-04 DIAGNOSIS — D225 Melanocytic nevi of trunk: Secondary | ICD-10-CM | POA: Diagnosis not present

## 2021-05-04 DIAGNOSIS — L718 Other rosacea: Secondary | ICD-10-CM | POA: Diagnosis not present

## 2021-05-04 DIAGNOSIS — L821 Other seborrheic keratosis: Secondary | ICD-10-CM | POA: Diagnosis not present

## 2021-05-04 MED ORDER — METRONIDAZOLE 0.75 % EX CREA
TOPICAL_CREAM | CUTANEOUS | 3 refills | Status: DC
  Filled 2021-05-04: qty 45, 30d supply, fill #0

## 2021-05-12 ENCOUNTER — Other Ambulatory Visit (HOSPITAL_COMMUNITY): Payer: Self-pay

## 2021-05-14 ENCOUNTER — Other Ambulatory Visit (HOSPITAL_COMMUNITY): Payer: Self-pay

## 2021-05-15 ENCOUNTER — Other Ambulatory Visit (HOSPITAL_COMMUNITY): Payer: Self-pay

## 2021-05-15 MED ORDER — METRONIDAZOLE 0.75 % EX CREA
1.0000 "application " | TOPICAL_CREAM | Freq: Every day | CUTANEOUS | 3 refills | Status: DC
  Filled 2021-05-15: qty 45, 30d supply, fill #0

## 2021-05-29 ENCOUNTER — Other Ambulatory Visit (HOSPITAL_COMMUNITY): Payer: Self-pay

## 2021-06-08 ENCOUNTER — Other Ambulatory Visit (HOSPITAL_COMMUNITY): Payer: Self-pay

## 2021-06-08 DIAGNOSIS — H524 Presbyopia: Secondary | ICD-10-CM | POA: Diagnosis not present

## 2021-06-08 MED ORDER — XIIDRA 5 % OP SOLN
1.0000 [drp] | Freq: Two times a day (BID) | OPHTHALMIC | 3 refills | Status: DC
  Filled 2021-06-08: qty 60, 30d supply, fill #0
  Filled 2021-11-13: qty 180, 90d supply, fill #0
  Filled 2022-05-10: qty 180, 90d supply, fill #1

## 2021-07-01 ENCOUNTER — Other Ambulatory Visit (HOSPITAL_COMMUNITY): Payer: Self-pay

## 2021-07-13 DIAGNOSIS — Z01419 Encounter for gynecological examination (general) (routine) without abnormal findings: Secondary | ICD-10-CM | POA: Diagnosis not present

## 2021-07-20 DIAGNOSIS — H02834 Dermatochalasis of left upper eyelid: Secondary | ICD-10-CM | POA: Diagnosis not present

## 2021-07-20 DIAGNOSIS — H02835 Dermatochalasis of left lower eyelid: Secondary | ICD-10-CM | POA: Diagnosis not present

## 2021-07-20 DIAGNOSIS — H02832 Dermatochalasis of right lower eyelid: Secondary | ICD-10-CM | POA: Diagnosis not present

## 2021-07-20 DIAGNOSIS — H02831 Dermatochalasis of right upper eyelid: Secondary | ICD-10-CM | POA: Diagnosis not present

## 2021-08-12 ENCOUNTER — Other Ambulatory Visit (HOSPITAL_COMMUNITY): Payer: Self-pay

## 2021-08-12 MED ORDER — ESTRADIOL 10 MCG VA TABS
10.0000 ug | ORAL_TABLET | VAGINAL | 3 refills | Status: AC
Start: 2021-08-13 — End: ?
  Filled 2021-08-12: qty 24, 84d supply, fill #0
  Filled 2021-11-13: qty 24, 84d supply, fill #1
  Filled 2022-03-01: qty 24, 84d supply, fill #2
  Filled 2022-07-07: qty 24, 84d supply, fill #3

## 2021-08-14 ENCOUNTER — Ambulatory Visit: Payer: 59 | Admitting: Gastroenterology

## 2021-08-19 ENCOUNTER — Other Ambulatory Visit (HOSPITAL_COMMUNITY): Payer: Self-pay

## 2021-10-13 ENCOUNTER — Other Ambulatory Visit (HOSPITAL_COMMUNITY): Payer: Self-pay

## 2021-10-13 DIAGNOSIS — H02832 Dermatochalasis of right lower eyelid: Secondary | ICD-10-CM | POA: Diagnosis not present

## 2021-10-13 DIAGNOSIS — H02834 Dermatochalasis of left upper eyelid: Secondary | ICD-10-CM | POA: Diagnosis not present

## 2021-10-13 DIAGNOSIS — Z411 Encounter for cosmetic surgery: Secondary | ICD-10-CM | POA: Diagnosis not present

## 2021-10-13 DIAGNOSIS — H02831 Dermatochalasis of right upper eyelid: Secondary | ICD-10-CM | POA: Diagnosis not present

## 2021-10-13 DIAGNOSIS — H02835 Dermatochalasis of left lower eyelid: Secondary | ICD-10-CM | POA: Diagnosis not present

## 2021-10-13 DIAGNOSIS — Z01818 Encounter for other preprocedural examination: Secondary | ICD-10-CM | POA: Diagnosis not present

## 2021-10-13 MED ORDER — NEOMYCIN-POLYMYXIN-DEXAMETH 3.5-10000-0.1 OP OINT
TOPICAL_OINTMENT | OPHTHALMIC | 1 refills | Status: DC
  Filled 2021-10-13: qty 3.5, 7d supply, fill #0
  Filled 2021-10-20: qty 3.5, 14d supply, fill #1
  Filled 2021-10-22: qty 3.5, 7d supply, fill #1

## 2021-10-13 MED ORDER — TRAMADOL HCL 50 MG PO TABS
50.0000 mg | ORAL_TABLET | Freq: Four times a day (QID) | ORAL | 0 refills | Status: DC | PRN
Start: 2021-10-13 — End: 2023-05-19
  Filled 2021-10-13: qty 10, 3d supply, fill #0

## 2021-10-20 ENCOUNTER — Other Ambulatory Visit (HOSPITAL_COMMUNITY): Payer: Self-pay

## 2021-10-22 ENCOUNTER — Other Ambulatory Visit (HOSPITAL_COMMUNITY): Payer: Self-pay

## 2021-10-23 ENCOUNTER — Other Ambulatory Visit (HOSPITAL_COMMUNITY): Payer: Self-pay

## 2021-10-23 DIAGNOSIS — E782 Mixed hyperlipidemia: Secondary | ICD-10-CM | POA: Diagnosis not present

## 2021-10-23 DIAGNOSIS — Z0000 Encounter for general adult medical examination without abnormal findings: Secondary | ICD-10-CM | POA: Diagnosis not present

## 2021-10-23 DIAGNOSIS — Z1329 Encounter for screening for other suspected endocrine disorder: Secondary | ICD-10-CM | POA: Diagnosis not present

## 2021-10-23 DIAGNOSIS — Z13228 Encounter for screening for other metabolic disorders: Secondary | ICD-10-CM | POA: Diagnosis not present

## 2021-10-23 DIAGNOSIS — K219 Gastro-esophageal reflux disease without esophagitis: Secondary | ICD-10-CM | POA: Diagnosis not present

## 2021-10-23 MED ORDER — ARMODAFINIL 150 MG PO TABS
150.0000 mg | ORAL_TABLET | Freq: Every day | ORAL | 3 refills | Status: DC
Start: 2021-10-23 — End: 2023-03-07
  Filled 2021-10-23: qty 30, 30d supply, fill #0
  Filled 2022-02-13: qty 30, 30d supply, fill #1

## 2021-10-23 MED ORDER — ESOMEPRAZOLE MAGNESIUM 40 MG PO CPDR
40.0000 mg | DELAYED_RELEASE_CAPSULE | Freq: Every day | ORAL | 3 refills | Status: DC
  Filled 2021-10-23: qty 90, 90d supply, fill #0
  Filled 2022-02-13: qty 90, 90d supply, fill #1
  Filled 2022-07-23: qty 90, 90d supply, fill #2
  Filled 2022-10-09: qty 90, 90d supply, fill #3

## 2021-10-23 MED ORDER — PRAVASTATIN SODIUM 80 MG PO TABS
80.0000 mg | ORAL_TABLET | Freq: Every evening | ORAL | 3 refills | Status: DC
Start: 2021-10-23 — End: 2022-11-22
  Filled 2021-10-23: qty 90, 90d supply, fill #0
  Filled 2022-02-13: qty 90, 90d supply, fill #1
  Filled 2022-07-23: qty 90, 90d supply, fill #2
  Filled 2022-10-09: qty 90, 90d supply, fill #3

## 2021-11-02 ENCOUNTER — Other Ambulatory Visit (HOSPITAL_COMMUNITY): Payer: Self-pay

## 2021-11-13 ENCOUNTER — Other Ambulatory Visit (HOSPITAL_COMMUNITY): Payer: Self-pay

## 2021-11-19 ENCOUNTER — Other Ambulatory Visit (HOSPITAL_COMMUNITY): Payer: Self-pay

## 2021-11-23 ENCOUNTER — Other Ambulatory Visit (HOSPITAL_COMMUNITY): Payer: Self-pay

## 2021-11-30 DIAGNOSIS — M545 Low back pain, unspecified: Secondary | ICD-10-CM | POA: Diagnosis not present

## 2022-01-04 DIAGNOSIS — J0100 Acute maxillary sinusitis, unspecified: Secondary | ICD-10-CM | POA: Diagnosis not present

## 2022-02-15 ENCOUNTER — Other Ambulatory Visit (HOSPITAL_COMMUNITY): Payer: Self-pay

## 2022-03-02 ENCOUNTER — Other Ambulatory Visit (HOSPITAL_COMMUNITY): Payer: Self-pay

## 2022-04-07 DIAGNOSIS — M545 Low back pain, unspecified: Secondary | ICD-10-CM | POA: Diagnosis not present

## 2022-04-13 ENCOUNTER — Telehealth: Payer: 59 | Admitting: Physician Assistant

## 2022-04-13 DIAGNOSIS — M545 Low back pain, unspecified: Secondary | ICD-10-CM

## 2022-04-13 DIAGNOSIS — R209 Unspecified disturbances of skin sensation: Secondary | ICD-10-CM

## 2022-04-13 DIAGNOSIS — M25551 Pain in right hip: Secondary | ICD-10-CM

## 2022-04-13 NOTE — Progress Notes (Signed)
Because of areas of numbness/altered sensation with pain continued > 1 week, I recommend that you be seen in a face to face visit with a provider. This is to make sure no further evaluation -- imaging, etc -- is needed and so proper ongoing treatment can be given.    NOTE: There will be NO CHARGE for this eVisit   If you are having a true medical emergency please call 911.      For an urgent face to face visit, [facility] has seven urgent care centers for your convenience:     [facility] [place] [facility] [place] Get Driving Directions [phone] [place] [place], [place] [address]    [facility] [place] [facility]) Get Driving Directions [phone] [place], [place] [address]  [facility] [place] ([place]) Get Driving Directions [phone] [address] [place],  [place]  [address]  [facility] - at [address] Get Driving Directions  [phone] [address] [place],  [place] [address]   [facility] Get Driving Directions [phone] [address] [place], [place], [place] [address]   [facility] Get Driving Directions  [phone] [address], [place] [address]   [facility] [place] Get Driving Directions [phone] [address], [place], [place] [address]  Your MyChart E-visit questionnaire answers were reviewed by a board certified advanced clinical practitioner to complete your personal care plan based on your specific symptoms.  Thank you for using e-Visits.

## 2022-04-21 ENCOUNTER — Other Ambulatory Visit (HOSPITAL_COMMUNITY): Payer: Self-pay

## 2022-04-22 DIAGNOSIS — M545 Low back pain, unspecified: Secondary | ICD-10-CM | POA: Diagnosis not present

## 2022-04-27 ENCOUNTER — Other Ambulatory Visit (HOSPITAL_COMMUNITY): Payer: Self-pay

## 2022-04-27 DIAGNOSIS — M25551 Pain in right hip: Secondary | ICD-10-CM | POA: Diagnosis not present

## 2022-04-27 MED ORDER — EZETIMIBE 10 MG PO TABS
10.0000 mg | ORAL_TABLET | Freq: Every day | ORAL | 3 refills | Status: DC
  Filled 2022-04-27: qty 90, 90d supply, fill #0
  Filled 2022-07-23: qty 90, 90d supply, fill #1
  Filled 2022-10-09: qty 90, 90d supply, fill #2
  Filled 2023-03-07: qty 90, 90d supply, fill #3

## 2022-04-28 ENCOUNTER — Other Ambulatory Visit (HOSPITAL_COMMUNITY): Payer: Self-pay

## 2022-05-11 ENCOUNTER — Other Ambulatory Visit: Payer: Self-pay

## 2022-05-11 ENCOUNTER — Other Ambulatory Visit (HOSPITAL_COMMUNITY): Payer: Self-pay

## 2022-05-14 NOTE — Progress Notes (Signed)
57 y.o. No obstetric history on file. Single Caucasian female here for NEW GYN concerns about prolapse for one year.  Has done Kegel's.  Prolapse is worse after BM.  No urinary incontinence or difficulty voiding.  No accidental leakage of stool.  No splinting.   Using vaginal estrogen twice weekly.  Heavy lifting at work, transferring patients.   Patient of Dr. Stann Mainland.  Prior surgical patient of mine.   PCP:   Janace Litten, MD  Patient's last menstrual period was 05/28/2010.           Sexually active: Yes.    The current method of family planning is status post hysterectomy.    Exercising: Yes.     Biking, pickleball, [place] Smoker:  no  Health Maintenance: Pap:  unsure per pt History of abnormal Pap:  yes, in her 20's MMG:  Pt states it was done last year, 12/24/20 done at [facility] Colonoscopy:  01/27/16.  Due for colonoscopy.  BMD:   n/a  Result  n/a TDaP:  Pt thinks 2-3 years ago Gardasil:   no Screening Labs:  PCP   reports that she has never smoked. She has never used smokeless tobacco. She reports that she does not drink alcohol and does not use drugs.  Past Medical History:  Diagnosis Date   Arthritis    Gestational diabetes    Headache    Hyperlipidemia    PONV (postoperative nausea and vomiting)    Seasonal allergies    Seizure ([place])    after taking sudafed - never proven    Past Surgical History:  Procedure Laterality Date   BREAST SURGERY  1991   breast implants    COLONOSCOPY     COLONOSCOPY WITH PROPOFOL N/A 01/27/2016   Procedure: COLONOSCOPY WITH PROPOFOL;  Surgeon: Clarene Essex, MD;  Location: [facility] ENDOSCOPY;  Service: Endoscopy;  Laterality: N/A;   ESOPHAGOGASTRODUODENOSCOPY (EGD) WITH PROPOFOL N/A 01/27/2016   Procedure: ESOPHAGOGASTRODUODENOSCOPY (EGD) WITH PROPOFOL;  Surgeon: Clarene Essex, MD;  Location: [facility] ENDOSCOPY;  Service: Endoscopy;  Laterality: N/A;   LIPOSUCTION     reverse tummy tuck     VAGINAL HYSTERECTOMY  11/07/2002   Total vaginal  hysterectomy, anterior and posterior colporrhaphy,  TVT procedure, cystoscopy.    Current Outpatient Medications  Medication Sig Dispense Refill   Armodafinil 150 MG tablet Take 1 tablet (150 mg total) by mouth daily. 30 tablet 3   calcium-vitamin D (OSCAL WITH D) 500-200 MG-UNIT per tablet Take 1 tablet by mouth 2 (two) times daily.     esomeprazole (NEXIUM) 40 MG capsule Take 1 capsule (40 mg total) by mouth daily before breakfast. 90 capsule 3   Estradiol 10 MCG TABS vaginal tablet Place 1 tablet (10 mcg total) vaginally 2 (two) times a week. 24 tablet 3   ezetimibe (ZETIA) 10 MG tablet Take 1 tablet (10 mg total) by mouth daily. 90 tablet 3   famotidine (PEPCID) 20 MG tablet      fish oil-omega-3 fatty acids 1000 MG capsule Take 100 mg by mouth 2 (two) times daily.     fluticasone (FLONASE) 50 MCG/ACT nasal spray      Lifitegrast (XIIDRA) 5 % SOLN Instill 1 drop into both eyes twice a day 180 each 3   loratadine (CLARITIN) 10 MG tablet      meloxicam (MOBIC) 15 MG tablet Take 1 tablet (15 mg total) by mouth daily. 90 tablet 0   pravastatin (PRAVACHOL) 80 MG tablet Take 1 tablet (80 mg total) by mouth  Nightly. 90 tablet 3   traMADol (ULTRAM) 50 MG tablet Take 1 tablet (50 mg total) by mouth every 6 (six) hours as needed for pain after surgery 10 tablet 0   Zoster Vaccine Adjuvanted [facility]) injection Inject 0.5 mLs into the muscle once for 1 dose. 0.5 mL 1   No current facility-administered medications for this visit.    Family History  Problem Relation Age of Onset   Diabetes Mellitus II Mother    Sleep apnea Mother    Hypertension Mother    High Cholesterol Mother    Stroke Mother    Hypertension Father    High Cholesterol Father    Hypertension Other    Hyperlipidemia Other     Review of Systems  All other systems reviewed and are negative.   Exam:   BP 130/86 (BP Location: Right Arm, Patient Position: Sitting, Cuff Size: Normal)   Pulse 68   Ht '5\' 4"'$  (1.626 m)    Wt 168 lb (76.2 kg)   LMP 05/28/2010   SpO2 98%   BMI 28.84 kg/m     General appearance: alert, cooperative and appears stated age Head: normocephalic, without obvious abnormality, atraumatic Neck: no adenopathy, supple, symmetrical, trachea midline and thyroid normal to inspection and palpation Lungs: clear to auscultation bilaterally Breasts: bilateral implants, no masses or tenderness, No nipple retraction or dimpling, No nipple discharge or bleeding, No axillary adenopathy Heart: regular rate and rhythm Abdomen: soft, non-tender; no masses, no organomegaly Extremities: extremities normal, atraumatic, no cyanosis or edema Skin: skin color, texture, turgor normal. No rashes or lesions Lymph nodes: cervical, supraclavicular, and axillary nodes normal. Neurologic: grossly normal  Pelvic: External genitalia:  no lesions              No abnormal inguinal nodes palpated.              Urethra:  normal appearing urethra with no masses, tenderness or lesions              Bartholins and Skenes: normal                 Vagina: normal appearing vagina with normal color and discharge, no lesions.  Second degree rectocele.  Good anterior and apical support.               Cervix: absent            Bimanual Exam:  Uterus:  absent              Adnexa: no mass, fullness, tenderness              Rectal exam: yes.  Confirms.              Anus:  normal sphincter tone, no lesions  Chaperone was present for exam:  yes  Assessment:   Well woman visit with gynecologic exam. Total vaginal hysterectomy, anterior and posterior colporrhaphy, TVT procedure, cystoscopy. Second degree rectocele.  Bilateral breast implants.  Using Vagifem.   Plan: Mammogram screening discussed.  Will get a copy of her mammogram from [facility] from 2023.  Self breast awareness reviewed. Pap and HR HPV not indicated.  Guidelines for Calcium, Vitamin D, regular exercise program including cardiovascular and weight  bearing exercise. We discussed treatment for the rectocele:  pelvic floor therapy, pessary, and rectocele repair.  She is aware that I am not providing surgical care, and that I would refer her to Dr. Sherlene Shams, urogynecologist.  She understands that chronic  heavy lifting is a risk factor for recurrence of pelvic organ prolapse.  Follow up annually and prn.   After visit summary provided.

## 2022-05-19 ENCOUNTER — Encounter: Payer: Commercial Managed Care - PPO | Admitting: Obstetrics and Gynecology

## 2022-05-24 ENCOUNTER — Other Ambulatory Visit (HOSPITAL_COMMUNITY): Payer: Self-pay

## 2022-05-24 MED ORDER — XIIDRA 5 % OP SOLN
1.0000 [drp] | Freq: Two times a day (BID) | OPHTHALMIC | 3 refills | Status: DC
  Filled 2022-05-24 – 2022-05-25 (×2): qty 180, 90d supply, fill #0
  Filled 2022-10-09 – 2022-11-04 (×2): qty 180, 90d supply, fill #1
  Filled 2023-03-07: qty 180, 90d supply, fill #2

## 2022-05-25 ENCOUNTER — Other Ambulatory Visit (HOSPITAL_COMMUNITY): Payer: Self-pay

## 2022-05-26 ENCOUNTER — Other Ambulatory Visit (HOSPITAL_COMMUNITY): Payer: Self-pay

## 2022-05-27 ENCOUNTER — Encounter: Payer: Self-pay | Admitting: Obstetrics and Gynecology

## 2022-05-27 ENCOUNTER — Ambulatory Visit: Payer: Commercial Managed Care - PPO | Admitting: Obstetrics and Gynecology

## 2022-05-27 VITALS — BP 130/86 | HR 68 | Ht 64.0 in | Wt 168.0 lb

## 2022-05-27 DIAGNOSIS — Z01419 Encounter for gynecological examination (general) (routine) without abnormal findings: Secondary | ICD-10-CM | POA: Diagnosis not present

## 2022-05-27 DIAGNOSIS — N816 Rectocele: Secondary | ICD-10-CM | POA: Diagnosis not present

## 2022-05-27 NOTE — Patient Instructions (Signed)

## 2022-05-31 ENCOUNTER — Other Ambulatory Visit (HOSPITAL_BASED_OUTPATIENT_CLINIC_OR_DEPARTMENT_OTHER): Payer: Self-pay | Admitting: Family Medicine

## 2022-05-31 DIAGNOSIS — B078 Other viral warts: Secondary | ICD-10-CM | POA: Diagnosis not present

## 2022-05-31 DIAGNOSIS — L718 Other rosacea: Secondary | ICD-10-CM | POA: Diagnosis not present

## 2022-05-31 DIAGNOSIS — L814 Other melanin hyperpigmentation: Secondary | ICD-10-CM | POA: Diagnosis not present

## 2022-05-31 DIAGNOSIS — Z1231 Encounter for screening mammogram for malignant neoplasm of breast: Secondary | ICD-10-CM

## 2022-05-31 DIAGNOSIS — L821 Other seborrheic keratosis: Secondary | ICD-10-CM | POA: Diagnosis not present

## 2022-05-31 DIAGNOSIS — D1801 Hemangioma of skin and subcutaneous tissue: Secondary | ICD-10-CM | POA: Diagnosis not present

## 2022-05-31 DIAGNOSIS — L438 Other lichen planus: Secondary | ICD-10-CM | POA: Diagnosis not present

## 2022-06-02 ENCOUNTER — Ambulatory Visit (HOSPITAL_BASED_OUTPATIENT_CLINIC_OR_DEPARTMENT_OTHER)
Admission: RE | Admit: 2022-06-02 | Discharge: 2022-06-02 | Disposition: A | Discharge status: Home / Self Care | Payer: Commercial Managed Care - PPO | Source: Ambulatory Visit | Attending: Family Medicine | Admitting: Family Medicine

## 2022-06-02 DIAGNOSIS — Z1231 Encounter for screening mammogram for malignant neoplasm of breast: Secondary | ICD-10-CM

## 2022-06-24 ENCOUNTER — Encounter: Payer: Self-pay | Admitting: Physical Therapy

## 2022-06-24 ENCOUNTER — Ambulatory Visit: Payer: PRIVATE HEALTH INSURANCE | Attending: Family Medicine | Admitting: Physical Therapy

## 2022-06-24 ENCOUNTER — Other Ambulatory Visit: Payer: Self-pay

## 2022-06-24 DIAGNOSIS — M6281 Muscle weakness (generalized): Secondary | ICD-10-CM | POA: Diagnosis present

## 2022-06-24 DIAGNOSIS — M546 Pain in thoracic spine: Secondary | ICD-10-CM | POA: Diagnosis present

## 2022-06-24 DIAGNOSIS — M25511 Pain in right shoulder: Secondary | ICD-10-CM | POA: Diagnosis present

## 2022-06-24 NOTE — Therapy (Addendum)
OUTPATIENT PHYSICAL THERAPY THORACOLUMBAR EVALUATION  Patient Name: [name] MRN: [id number] DOB:[birth year], 57 y.o., female Today's Date: 06/24/2022   PT End of Session - 06/24/22 1046     Visit Number 1    Number of Visits --   1-2x/week   Date for PT Re-Evaluation 08/19/22    Authorization Type WC - FOTO    PT Start Time 1000    PT Stop Time 1047    PT Time Calculation (min) 47 min             Past Medical History:  Diagnosis Date   Arthritis    Gestational diabetes    Headache    Hyperlipidemia    PONV (postoperative nausea and vomiting)    Seasonal allergies    Seizure ([place])    after taking sudafed - never proven   Past Surgical History:  Procedure Laterality Date   BREAST SURGERY  1991   breast implants    COLONOSCOPY     COLONOSCOPY WITH PROPOFOL N/A 01/27/2016   Procedure: COLONOSCOPY WITH PROPOFOL;  Surgeon: Clarene Essex, MD;  Location: [place];  Service: Endoscopy;  Laterality: N/A;   ESOPHAGOGASTRODUODENOSCOPY (EGD) WITH PROPOFOL N/A 01/27/2016   Procedure: ESOPHAGOGASTRODUODENOSCOPY (EGD) WITH PROPOFOL;  Surgeon: Clarene Essex, MD;  Location: [facility] ENDOSCOPY;  Service: Endoscopy;  Laterality: N/A;   LIPOSUCTION     reverse tummy tuck     VAGINAL HYSTERECTOMY  11/07/2002   Total vaginal hysterectomy, anterior and posterior colporrhaphy,  TVT procedure, cystoscopy.   Patient Active Problem List   Diagnosis Date Noted   Annual physical exam 07/19/2019   Recurrent left knee instability 11/09/2018   GERD without esophagitis 06/16/2017   Mixed dyslipidemia 06/16/2017   Lumbar back pain 04/24/2013   Cervicalgia 03/13/2013   Myofascial pain on right side 03/13/2013   Tendinopathy of right rotator cuff 03/13/2013   Hypercholesterolemia 09/21/2011    PCP: Myrlene Broker, MD  REFERRING PROVIDER: Odis Luster, MD  THERAPY DIAG:  Pain in thoracic spine - Plan: PT plan of care cert/re-cert  Right shoulder pain, unspecified chronicity - Plan:  PT plan of care cert/re-cert  Muscle weakness - Plan: PT plan of care cert/re-cert  REFERRING DIAG: Sprain of ligaments of thoracic spine [S23.3XXA], Strain of other muscles, fascia and tendons at shoulder and upper arm level, right arm, initial encounter [S46.811A], Strain of back [S39.012A]   Rationale for Evaluation and Treatment:  Rehabilitation  SUBJECTIVE:  PERTINENT PAST HISTORY:  none        PRECAUTIONS: None  WEIGHT BEARING RESTRICTIONS No  FALLS:  Has patient fallen in last 6 months? No, Number of falls: 0  MOI/History of condition:  Onset date: 2/1  SUBJECTIVE STATEMENT  [name] is a 57 y.o. female who presents to clinic with chief complaint of upper R upper trap pain, headaches, and thoracic spine pain following a lifting injury on 05/27/2022.  The pain started in her mid thoracic spine with muscle spasms, but after 3 days she started having more muscle spasms into the shoulder blades and her UTs.  She has intermittent headaches which occur 2-3x/day.  She has been using a lacrosse ball for pain relief.  From referring provider:      Red flags:  denies bil numbness and tingling  Pain:  Are you having pain? Yes Pain location: R UT, thoracic paraspinals NPRS scale:  2/10 to 7/10 Aggravating factors: worse with activity Relieving factors: rest Pain description: dull and aching Stage: Acute Stability:  staying the same, headaches may be getting worse 24 hour pattern: worse with activity   Occupation: nurse in inpatient rehab  Assistive Device: NA  Hand Dominance: R  Patient Goals/Specific Activities: reduce pain, "get back normal"    OBJECTIVE:   DIAGNOSTIC FINDINGS:  none  GENERAL OBSERVATION/GAIT:  No gross deficits  SENSATION:  Light touch: Appears intact  Cervical ROM  ROM ROM  06/24/2022  Flexion n  Extension n  Right lateral flexion n  Left lateral flexion N*  Right rotation n  Left rotation n  Flexion rotation (normal is 30  degrees)   Flexion rotation (normal is 30 degrees)     (Blank rows = not tested, N = WNL, * = concordant pain)  THORACIC AROM  AROM AROM  06/24/2022  Flexion WNL *  Extension WNL  Right rotation WNL  Left rotation WNL    (Blank rows = not tested)   LUMBAR AROM  AROM AROM  06/24/2022  Flexion Fingertips to mid shin (limited by ~25%), w/ concordant pain  Extension limited by 25%  Right lateral flexion WNL  Left lateral flexion WNL  Right rotation WNL  Left rotation WNL    (Blank rows = not tested)   UPPER EXTREMITY MMT:  MMT Right 06/24/2022 Left 06/24/2022  Shoulder flexion 5 5  Shoulder abduction (C5)    Shoulder ER    Shoulder IR    Middle trapezius 4 4  Lower trapezius 3+ 3  Shoulder extension    Grip strength    Shoulder shrug (C4)    Elbow flexion (C6)    Elbow ext (C7)    Thumb ext (C8)    Finger abd (T1)    Grossly     (Blank rows = not tested, score listed is out of 5 possible points.  N = WNL, D = diminished, C = clear for gross weakness with myotome testing, * = concordant pain with testing)    LUMBAR SPECIAL TESTS:  Straight leg raise: L (-), R (-) Slump: L (-), R (-)    Functional Tests  Eval (06/24/2022)                                                                PALPATION:   TTP around TL junction, R UT, R LS, R sub occipitals  PATIENT SURVEYS:  FOTO 65 -> 68   TODAY'S TREATMENT   Therex: Creating, reviewing, and completing below HEP  Manual therapy: Skilled palpation to identify trigger points for TDN STM to all listed muscles following TDN  Trigger Point Dry-Needling  Treatment instructions: Expect mild to moderate muscle soreness. S/S of pneumothorax if dry needled over a lung field, and to seek immediate medical attention should they occur. Patient verbalized understanding of these instructions and education.  Patient Consent Given: Yes Education handout provided: No Muscles treated: bil paraspinals  bracketing TL junction (estim), bil sub occipitals, R UT Electrical stimulation performed: Yes Parameters: 8 min low frequency - milli amps - low intensity Treatment response/outcome: twitch  PATIENT EDUCATION:  POC, diagnosis, prognosis, HEP, and outcome measures.  Pt educated via explanation, demonstration, and handout (HEP).  Pt confirms understanding verbally.   HOME EXERCISE PROGRAM: Access Code: [email or web address] URL: [email or web address] Date: 06/24/2022 Prepared by: Shearon Balo  Exercises -  Sidelying Thoracic Rotation with Open Book  - 1 x daily - 7 x weekly - 1 sets - 20 reps - 2 hold  Treatment priorities   Eval (06/24/2022)        TDN/manual        Thoracic rotation        Periscapular strengthening                          ASSESSMENT:  CLINICAL IMPRESSION: [name] is a 57 y.o. female who presents to clinic with signs and sxs consistent with low back, thoracic, and R UT pain consistent with physician impression of myofacial strain following lifting injury.  Overall good ROM and strength with exception of mid and lower traps.  OBJECTIVE IMPAIRMENTS: Pain, lumbar ROM, pain with thoracic flexion, periscapular strength  ACTIVITY LIMITATIONS: working and recreation without pain  PERSONAL FACTORS: See medical history and pertinent history   REHAB POTENTIAL: Good  CLINICAL DECISION MAKING: Stable/uncomplicated  EVALUATION COMPLEXITY: Low   GOALS:   SHORT TERM GOALS: Target date: 07/22/2022  [name] will be >75% HEP compliant to improve carryover between sessions and facilitate independent management of condition  Evaluation (06/24/2022): ongoing Goal status: INITIAL   LONG TERM GOALS: Target date: 08/19/2022  [name] will improve FOTO score to 68 as a proxy for functional improvement  Evaluation/Baseline (06/24/2022): 65 Goal status: INITIAL  2.  [name] will self report >/= 75% decrease in pain from evaluation   Evaluation/Baseline (06/24/2022):  7/10 max pain Goal status: INITIAL   3.  [name] will be able to return to work full duty, not limited by pain  Evaluation/Baseline (06/24/2022): limited Goal status: INITIAL   4.  [name] will be able to lift 30 lbs from the floor and place on a 3 foot counter, not limited by pain   Evaluation/Baseline (06/24/2022): unable Goal status: INITIAL   PLAN: PT FREQUENCY: 1-2x/week  PT DURATION: 8 weeks (Ending 08/19/2022)  PLANNED INTERVENTIONS: Therapeutic exercises, Aquatic therapy, Therapeutic activity, Neuro Muscular re-education, Gait training, Patient/Family education, Joint mobilization, Dry Needling, Electrical stimulation, Spinal mobilization and/or manipulation, Moist heat, Taping, Vasopneumatic device, Ionotophoresis '4mg'$ /ml Dexamethasone, and Manual therapy   Shearon Balo PT, DPT 06/24/2022, 11:34 AM

## 2022-07-01 ENCOUNTER — Encounter: Payer: Self-pay | Admitting: Physical Therapy

## 2022-07-01 ENCOUNTER — Ambulatory Visit: Payer: PRIVATE HEALTH INSURANCE | Attending: Family Medicine | Admitting: Physical Therapy

## 2022-07-01 DIAGNOSIS — M25511 Pain in right shoulder: Secondary | ICD-10-CM | POA: Insufficient documentation

## 2022-07-01 DIAGNOSIS — M546 Pain in thoracic spine: Secondary | ICD-10-CM | POA: Diagnosis present

## 2022-07-01 DIAGNOSIS — M6281 Muscle weakness (generalized): Secondary | ICD-10-CM | POA: Insufficient documentation

## 2022-07-01 NOTE — Therapy (Signed)
OUTPATIENT PHYSICAL THERAPY TREATMENT NOTE   Patient Name: [name] MRN: [id number] DOB:[birth year], 57 y.o., female Today's Date: 07/01/2022  PCP: Myrlene Broker, MD   REFERRING PROVIDER: Odis Luster, MD   PT End of Session - 07/01/22 [id number]     Visit Number 2    Number of Visits --   1-2x/week   Date for PT Re-Evaluation 08/19/22    Authorization Type WC - FOTO    PT Start Time 0745    PT Stop Time 0826    PT Time Calculation (min) 41 min             Past Medical History:  Diagnosis Date   Arthritis    Gestational diabetes    Headache    Hyperlipidemia    PONV (postoperative nausea and vomiting)    Seasonal allergies    Seizure ([place])    after taking sudafed - never proven   Past Surgical History:  Procedure Laterality Date   BREAST SURGERY  1991   breast implants    COLONOSCOPY     COLONOSCOPY WITH PROPOFOL N/A 01/27/2016   Procedure: COLONOSCOPY WITH PROPOFOL;  Surgeon: Clarene Essex, MD;  Location: [place];  Service: Endoscopy;  Laterality: N/A;   ESOPHAGOGASTRODUODENOSCOPY (EGD) WITH PROPOFOL N/A 01/27/2016   Procedure: ESOPHAGOGASTRODUODENOSCOPY (EGD) WITH PROPOFOL;  Surgeon: Clarene Essex, MD;  Location: [facility] ENDOSCOPY;  Service: Endoscopy;  Laterality: N/A;   LIPOSUCTION     reverse tummy tuck     VAGINAL HYSTERECTOMY  11/07/2002   Total vaginal hysterectomy, anterior and posterior colporrhaphy,  TVT procedure, cystoscopy.   Patient Active Problem List   Diagnosis Date Noted   Annual physical exam 07/19/2019   Recurrent left knee instability 11/09/2018   GERD without esophagitis 06/16/2017   Mixed dyslipidemia 06/16/2017   Lumbar back pain 04/24/2013   Cervicalgia 03/13/2013   Myofascial pain on right side 03/13/2013   Tendinopathy of right rotator cuff 03/13/2013   Hypercholesterolemia 09/21/2011    THERAPY DIAG:  Pain in thoracic spine  Right shoulder pain, unspecified chronicity  Muscle weakness   Rationale for Evaluation and  Treatment Rehabilitation  REFERRING DIAG: Sprain of ligaments of thoracic spine [S23.3XXA], Strain of other muscles, fascia and tendons at shoulder and upper arm level, right arm, initial encounter [S46.811A], Strain of back [S39.012A]   PERTINENT HISTORY: none     PRECAUTIONS/RESTRICTIONS:   none  SUBJECTIVE:  Pt reports that she was stiff after TDN but then felt considerably better.  She reports her pain level is around a 3/10 today and is typically worse in the mornings.  Pain:  Are you having pain? Yes Pain location: R UT, thoracic paraspinals NPRS scale:  2/10 to 7/10 Aggravating factors: worse with activity Relieving factors: rest Pain description: dull and aching Stage: Acute Stability: staying the same, headaches may be getting worse 24 hour pattern: worse with activity   OBJECTIVE: (objective measures completed at initial evaluation unless otherwise dated)  DIAGNOSTIC FINDINGS:  none   GENERAL OBSERVATION/GAIT:           No gross deficits   SENSATION:          Light touch: Appears intact   Cervical ROM   ROM ROM  06/24/2022  Flexion n  Extension n  Right lateral flexion n  Left lateral flexion N*  Right rotation n  Left rotation n  Flexion rotation (normal is 30 degrees)    Flexion rotation (normal is 30 degrees)      (  Blank rows = not tested, N = WNL, * = concordant pain)   THORACIC AROM   AROM AROM  06/24/2022  Flexion WNL *  Extension WNL  Right rotation WNL  Left rotation WNL    (Blank rows = not tested)    LUMBAR AROM   AROM AROM  06/24/2022  Flexion Fingertips to mid shin (limited by ~25%), w/ concordant pain  Extension limited by 25%  Right lateral flexion WNL  Left lateral flexion WNL  Right rotation WNL  Left rotation WNL    (Blank rows = not tested)    UPPER EXTREMITY MMT:   MMT Right 06/24/2022 Left 06/24/2022  Shoulder flexion 5 5  Shoulder abduction (C5)      Shoulder ER      Shoulder IR      Middle trapezius 4 4   Lower trapezius 3+ 3  Shoulder extension      Grip strength      Shoulder shrug (C4)      Elbow flexion (C6)      Elbow ext (C7)      Thumb ext (C8)      Finger abd (T1)      Grossly        (Blank rows = not tested, score listed is out of 5 possible points.  N = WNL, D = diminished, C = clear for gross weakness with myotome testing, * = concordant pain with testing)      LUMBAR SPECIAL TESTS:  Straight leg raise: L (-), R (-) Slump: L (-), R (-)       Functional Tests   Eval (06/24/2022)                                                                                                               PALPATION:            TTP around TL junction, R UT, R LS, R sub occipitals   PATIENT SURVEYS:  FOTO 65 -> 68     TODAY'S TREATMENT    Therex: Creating, reviewing, and completing below HEP   Manual therapy: Skilled palpation to identify trigger points for TDN STM to all listed muscles following TDN   Trigger Point Dry-Needling  Treatment instructions: Expect mild to moderate muscle soreness. S/S of pneumothorax if dry needled over a lung field, and to seek immediate medical attention should they occur. Patient verbalized understanding of these instructions and education.   Patient Consent Given: Yes Education handout provided: No Muscles treated: bil paraspinals bracketing TL junction (estim), bil sub occipitals, R UT Electrical stimulation performed: Yes Parameters: 8 min low frequency - milli amps - low intensity Treatment response/outcome: twitch   PATIENT EDUCATION:  POC, diagnosis, prognosis, HEP, and outcome measures.  Pt educated via explanation, demonstration, and handout (HEP).  Pt confirms understanding verbally.    HOME EXERCISE PROGRAM: Access Code: [email or web address] URL: [email or web address] Date: 06/24/2022 Prepared by: Shearon Balo   Exercises - Sidelying Thoracic Rotation with Open Book  - 1 x daily - 7  x weekly - 1 sets - 20  reps - 2 hold   Treatment priorities     Eval (06/24/2022)              TDN/manual              Thoracic rotation              Periscapular strengthening                                               TREATMENT 07/01/22:  Therapeutic Exercise: - UBE 2.5'/2.5' fwd and backward for warm up while taking subjective - S/L open book - Foam roller routine for thoracic mobility - including protraction/retraction (unilateral and bilateral), cc/cw circles, horizontal abduction, shoulder flexion/ext alternating, shoulder abduction - sit to stand w/ OH press - 10# - 3x10  Manual therapy: Skilled palpation to identify trigger points for TDN STM to all listed muscles following TDN   Trigger Point Dry-Needling  Treatment instructions: Expect mild to moderate muscle soreness. S/S of pneumothorax if dry needled over a lung field, and to seek immediate medical attention should they occur. Patient verbalized understanding of these instructions and education.   Patient Consent Given: Yes Education handout provided: No Muscles treated: bil paraspinals bracketing TL junction (estim), bil sub occipitals, R UT Electrical stimulation performed: Yes Parameters: 8 min low frequency - milli amps - low intensity Treatment response/outcome: twitch   ASSESSMENT:   CLINICAL IMPRESSION: Guenevere tolerated session well with no adverse reaction.  Continued with TDN and estim with addition of scapulothoracic mobility today.  Pt reports that she was stiff after last TDN but ultimately had sxs relief with reduced headache frequency.  Will monitor for response at next visit.   OBJECTIVE IMPAIRMENTS: Pain, lumbar ROM, pain with thoracic flexion, periscapular strength   ACTIVITY LIMITATIONS: working and recreation without pain   PERSONAL FACTORS: See medical history and pertinent history     REHAB POTENTIAL: Good   CLINICAL DECISION MAKING: Stable/uncomplicated   EVALUATION COMPLEXITY: Low     GOALS:      SHORT TERM GOALS: Target date: 07/22/2022   Kaidyn will be >75% HEP compliant to improve carryover between sessions and facilitate independent management of condition   Evaluation (06/24/2022): ongoing Goal status: INITIAL     LONG TERM GOALS: Target date: 08/19/2022   Khole will improve FOTO score to 68 as a proxy for functional improvement   Evaluation/Baseline (06/24/2022): 65 Goal status: INITIAL   2.  Karolyna will self report >/= 75% decrease in pain from evaluation    Evaluation/Baseline (06/24/2022): 7/10 max pain Goal status: INITIAL     3.  Frantasia will be able to return to work full duty, not limited by pain   Evaluation/Baseline (06/24/2022): limited Goal status: INITIAL     4.  Denijah will be able to lift 30 lbs from the floor and place on a 3 foot counter, not limited by pain    Evaluation/Baseline (06/24/2022): unable Goal status: INITIAL     PLAN: PT FREQUENCY: 1-2x/week   PT DURATION: 8 weeks (Ending 08/19/2022)   PLANNED INTERVENTIONS: Therapeutic exercises, Aquatic therapy, Therapeutic activity, Neuro Muscular re-education, Gait training, Patient/Family education, Joint mobilization, Dry Needling, Electrical stimulation, Spinal mobilization and/or manipulation, Moist heat, Taping, Vasopneumatic device, Ionotophoresis '4mg'$ /ml Dexamethasone, and Manual therapy   Kevan Ny Zowie Lundahl PT 07/01/2022, 9:33 AM

## 2022-07-06 ENCOUNTER — Encounter: Payer: Self-pay | Admitting: Physical Therapy

## 2022-07-06 ENCOUNTER — Ambulatory Visit: Payer: PRIVATE HEALTH INSURANCE | Attending: Family Medicine | Admitting: Physical Therapy

## 2022-07-06 DIAGNOSIS — M25511 Pain in right shoulder: Secondary | ICD-10-CM | POA: Insufficient documentation

## 2022-07-06 DIAGNOSIS — M6281 Muscle weakness (generalized): Secondary | ICD-10-CM | POA: Diagnosis present

## 2022-07-06 DIAGNOSIS — M546 Pain in thoracic spine: Secondary | ICD-10-CM | POA: Diagnosis not present

## 2022-07-06 NOTE — Therapy (Signed)
OUTPATIENT PHYSICAL THERAPY TREATMENT NOTE   Patient Name: [name] MRN: [id number] DOB:April 13, 1966, 57 y.o., female Today's Date: 07/06/2022  PCP: Myrlene Broker, MD   REFERRING PROVIDER: Odis Luster, MD   PT End of Session - 07/06/22 1615     Visit Number 3    Number of Visits --   1-2x/week   Date for PT Re-Evaluation 08/19/22    Authorization Type WC - FOTO    PT Start Time 0415    PT Stop Time 0456    PT Time Calculation (min) 41 min             Past Medical History:  Diagnosis Date   Arthritis    Gestational diabetes    Headache    Hyperlipidemia    PONV (postoperative nausea and vomiting)    Seasonal allergies    Seizure ([place])    after taking sudafed - never proven   Past Surgical History:  Procedure Laterality Date   BREAST SURGERY  1991   breast implants    COLONOSCOPY     COLONOSCOPY WITH PROPOFOL N/A 01/27/2016   Procedure: COLONOSCOPY WITH PROPOFOL;  Surgeon: Clarene Essex, MD;  Location: [facility];  Service: Endoscopy;  Laterality: N/A;   ESOPHAGOGASTRODUODENOSCOPY (EGD) WITH PROPOFOL N/A 01/27/2016   Procedure: ESOPHAGOGASTRODUODENOSCOPY (EGD) WITH PROPOFOL;  Surgeon: Clarene Essex, MD;  Location: [facility] ENDOSCOPY;  Service: Endoscopy;  Laterality: N/A;   LIPOSUCTION     reverse tummy tuck     VAGINAL HYSTERECTOMY  11/07/2002   Total vaginal hysterectomy, anterior and posterior colporrhaphy,  TVT procedure, cystoscopy.   Patient Active Problem List   Diagnosis Date Noted   Annual physical exam 07/19/2019   Recurrent left knee instability 11/09/2018   GERD without esophagitis 06/16/2017   Mixed dyslipidemia 06/16/2017   Lumbar back pain 04/24/2013   Cervicalgia 03/13/2013   Myofascial pain on right side 03/13/2013   Tendinopathy of right rotator cuff 03/13/2013   Hypercholesterolemia 09/21/2011    THERAPY DIAG:  Pain in thoracic spine  Right shoulder pain, unspecified chronicity  Muscle weakness   Rationale for Evaluation and  Treatment Rehabilitation  REFERRING DIAG: Sprain of ligaments of thoracic spine [S23.3XXA], Strain of other muscles, fascia and tendons at shoulder and upper arm level, right arm, initial encounter [S46.811A], Strain of back [S39.012A]   PERTINENT HISTORY: none     PRECAUTIONS/RESTRICTIONS:   none  SUBJECTIVE:  Pt reports that her pain is improving.  Pain:  Are you having pain? Yes Pain location: R UT, thoracic paraspinals NPRS scale:  2/10 to 7/10 Aggravating factors: worse with activity Relieving factors: rest Pain description: dull and aching Stage: Acute Stability: staying the same, headaches may be getting worse 24 hour pattern: worse with activity   OBJECTIVE: (objective measures completed at initial evaluation unless otherwise dated)  DIAGNOSTIC FINDINGS:  none   GENERAL OBSERVATION/GAIT:           No gross deficits   SENSATION:          Light touch: Appears intact   Cervical ROM   ROM ROM  06/24/2022  Flexion n  Extension n  Right lateral flexion n  Left lateral flexion N*  Right rotation n  Left rotation n  Flexion rotation (normal is 30 degrees)    Flexion rotation (normal is 30 degrees)      (Blank rows = not tested, N = WNL, * = concordant pain)   THORACIC AROM   AROM AROM  06/24/2022  Flexion  WNL *  Extension WNL  Right rotation WNL  Left rotation WNL    (Blank rows = not tested)    LUMBAR AROM   AROM AROM  06/24/2022  Flexion Fingertips to mid shin (limited by ~25%), w/ concordant pain  Extension limited by 25%  Right lateral flexion WNL  Left lateral flexion WNL  Right rotation WNL  Left rotation WNL    (Blank rows = not tested)    UPPER EXTREMITY MMT:   MMT Right 06/24/2022 Left 06/24/2022  Shoulder flexion 5 5  Shoulder abduction (C5)      Shoulder ER      Shoulder IR      Middle trapezius 4 4  Lower trapezius 3+ 3  Shoulder extension      Grip strength      Shoulder shrug (C4)      Elbow flexion (C6)      Elbow ext  (C7)      Thumb ext (C8)      Finger abd (T1)      Grossly        (Blank rows = not tested, score listed is out of 5 possible points.  N = WNL, D = diminished, C = clear for gross weakness with myotome testing, * = concordant pain with testing)      LUMBAR SPECIAL TESTS:  Straight leg raise: L (-), R (-) Slump: L (-), R (-)       Functional Tests   Eval (06/24/2022)                                                                                                               PALPATION:            TTP around TL junction, R UT, R LS, R sub occipitals   PATIENT SURVEYS:  FOTO 65 -> 68     TODAY'S TREATMENT      PATIENT EDUCATION:  POC, diagnosis, prognosis, HEP, and outcome measures.  Pt educated via explanation, demonstration, and handout (HEP).  Pt confirms understanding verbally.    HOME EXERCISE PROGRAM: Access Code: [email or web address] URL: [email or web address] Date: 06/24/2022 Prepared by: Shearon Balo   Exercises - Sidelying Thoracic Rotation with Open Book  - 1 x daily - 7 x weekly - 1 sets - 20 reps - 2 hold   Treatment priorities     Eval (06/24/2022)              TDN/manual              Thoracic rotation              Periscapular strengthening                                               TREATMENT 07/06/22:  Therapeutic Exercise: - UBE 2.5'/2.5' fwd and backward for warm up while taking subjective - S/L open book -  10x - prone IYT - 2x10 - low row - 20# - 2x10 - high row - 20# - 2x10 - lat pull down - 20# - 2x10 - sit to stand w/ OH press - 10# - 3x10  Manual therapy: Skilled palpation to identify trigger points for TDN STM to all listed muscles following TDN   Trigger Point Dry-Needling  Treatment instructions: Expect mild to moderate muscle soreness. S/S of pneumothorax if dry needled over a lung field, and to seek immediate medical attention should they occur. Patient verbalized understanding of these instructions and  education.   Patient Consent Given: Yes Education handout provided: No Muscles treated: bil paraspinals bracketing TL junction (estim), bil sub occipitals, R UT Electrical stimulation performed: Yes Parameters: 8 min low frequency - milli amps - low intensity Treatment response/outcome: twitch   ASSESSMENT:   CLINICAL IMPRESSION: Abimbola tolerated session well with no adverse reaction.  Continued with progressive strengthening + TDN and estim to good effect.  Added increased periscapular and paraspinal load with IYT today.    OBJECTIVE IMPAIRMENTS: Pain, lumbar ROM, pain with thoracic flexion, periscapular strength   ACTIVITY LIMITATIONS: working and recreation without pain   PERSONAL FACTORS: See medical history and pertinent history     REHAB POTENTIAL: Good   CLINICAL DECISION MAKING: Stable/uncomplicated   EVALUATION COMPLEXITY: Low     GOALS:     SHORT TERM GOALS: Target date: 07/22/2022   Bethani will be >75% HEP compliant to improve carryover between sessions and facilitate independent management of condition   Evaluation (06/24/2022): ongoing Goal status: INITIAL     LONG TERM GOALS: Target date: 08/19/2022   Satira will improve FOTO score to 68 as a proxy for functional improvement   Evaluation/Baseline (06/24/2022): 65 Goal status: INITIAL   2.  Isia will self report >/= 75% decrease in pain from evaluation    Evaluation/Baseline (06/24/2022): 7/10 max pain Goal status: INITIAL     3.  Aireanna will be able to return to work full duty, not limited by pain   Evaluation/Baseline (06/24/2022): limited Goal status: INITIAL     4.  Maryangel will be able to lift 30 lbs from the floor and place on a 3 foot counter, not limited by pain    Evaluation/Baseline (06/24/2022): unable Goal status: INITIAL     PLAN: PT FREQUENCY: 1-2x/week   PT DURATION: 8 weeks (Ending 08/19/2022)   PLANNED INTERVENTIONS: Therapeutic exercises, Aquatic therapy, Therapeutic activity,  Neuro Muscular re-education, Gait training, Patient/Family education, Joint mobilization, Dry Needling, Electrical stimulation, Spinal mobilization and/or manipulation, Moist heat, Taping, Vasopneumatic device, Ionotophoresis '4mg'$ /ml Dexamethasone, and Manual therapy   Kevan Ny Aloysious Vangieson PT 07/06/2022, 4:57 PM

## 2022-07-13 ENCOUNTER — Ambulatory Visit: Payer: PRIVATE HEALTH INSURANCE

## 2022-07-16 ENCOUNTER — Encounter: Payer: Self-pay | Admitting: Physical Therapy

## 2022-07-16 ENCOUNTER — Ambulatory Visit: Payer: PRIVATE HEALTH INSURANCE | Attending: Family Medicine | Admitting: Physical Therapy

## 2022-07-16 DIAGNOSIS — M25511 Pain in right shoulder: Secondary | ICD-10-CM | POA: Diagnosis present

## 2022-07-16 DIAGNOSIS — M6281 Muscle weakness (generalized): Secondary | ICD-10-CM | POA: Insufficient documentation

## 2022-07-16 DIAGNOSIS — M546 Pain in thoracic spine: Secondary | ICD-10-CM | POA: Diagnosis not present

## 2022-07-16 NOTE — Therapy (Signed)
OUTPATIENT PHYSICAL THERAPY TREATMENT NOTE   Patient Name: [name] MRN: [id number] DOB:06-Mar-1966, 57 y.o., female Today's Date: 07/16/2022  PCP: Myrlene Broker, MD   REFERRING PROVIDER: Odis Luster, MD   PT End of Session - 07/16/22 1002     Visit Number 4    Number of Visits --   1-2x/week   Date for PT Re-Evaluation 08/19/22    Authorization Type WC - FOTO    PT Start Time 1000    PT Stop Time 1040    PT Time Calculation (min) 40 min             Past Medical History:  Diagnosis Date   Arthritis    Gestational diabetes    Headache    Hyperlipidemia    PONV (postoperative nausea and vomiting)    Seasonal allergies    Seizure ([place])    after taking sudafed - never proven   Past Surgical History:  Procedure Laterality Date   BREAST SURGERY  1991   breast implants    COLONOSCOPY     COLONOSCOPY WITH PROPOFOL N/A 01/27/2016   Procedure: COLONOSCOPY WITH PROPOFOL;  Surgeon: Clarene Essex, MD;  Location: [place];  Service: Endoscopy;  Laterality: N/A;   ESOPHAGOGASTRODUODENOSCOPY (EGD) WITH PROPOFOL N/A 01/27/2016   Procedure: ESOPHAGOGASTRODUODENOSCOPY (EGD) WITH PROPOFOL;  Surgeon: Clarene Essex, MD;  Location: [facility] ENDOSCOPY;  Service: Endoscopy;  Laterality: N/A;   LIPOSUCTION     reverse tummy tuck     VAGINAL HYSTERECTOMY  11/07/2002   Total vaginal hysterectomy, anterior and posterior colporrhaphy,  TVT procedure, cystoscopy.   Patient Active Problem List   Diagnosis Date Noted   Annual physical exam 07/19/2019   Recurrent left knee instability 11/09/2018   GERD without esophagitis 06/16/2017   Mixed dyslipidemia 06/16/2017   Lumbar back pain 04/24/2013   Cervicalgia 03/13/2013   Myofascial pain on right side 03/13/2013   Tendinopathy of right rotator cuff 03/13/2013   Hypercholesterolemia 09/21/2011    THERAPY DIAG:  Pain in thoracic spine  Right shoulder pain, unspecified chronicity  Muscle weakness   Rationale for Evaluation and  Treatment Rehabilitation  REFERRING DIAG: Sprain of ligaments of thoracic spine [S23.3XXA], Strain of other muscles, fascia and tendons at shoulder and upper arm level, right arm, initial encounter [S46.811A], Strain of back [S39.012A]   PERTINENT HISTORY: none     PRECAUTIONS/RESTRICTIONS:   none  SUBJECTIVE:  Pt reports that her shoulder and back continue to improve.  Pain:  Are you having pain? Yes Pain location: R UT, thoracic paraspinals NPRS scale:  1-2/10 to 7/10 Aggravating factors: worse with activity Relieving factors: rest Pain description: dull and aching Stage: Acute Stability: staying the same, headaches may be getting worse 24 hour pattern: worse with activity   OBJECTIVE: (objective measures completed at initial evaluation unless otherwise dated)  DIAGNOSTIC FINDINGS:  none   GENERAL OBSERVATION/GAIT:           No gross deficits   SENSATION:          Light touch: Appears intact   Cervical ROM   ROM ROM  06/24/2022  Flexion n  Extension n  Right lateral flexion n  Left lateral flexion N*  Right rotation n  Left rotation n  Flexion rotation (normal is 30 degrees)    Flexion rotation (normal is 30 degrees)      (Blank rows = not tested, N = WNL, * = concordant pain)   THORACIC AROM   AROM AROM  06/24/2022  Flexion WNL *  Extension WNL  Right rotation WNL  Left rotation WNL    (Blank rows = not tested)    LUMBAR AROM   AROM AROM  06/24/2022  Flexion Fingertips to mid shin (limited by ~25%), w/ concordant pain  Extension limited by 25%  Right lateral flexion WNL  Left lateral flexion WNL  Right rotation WNL  Left rotation WNL    (Blank rows = not tested)    UPPER EXTREMITY MMT:   MMT Right 06/24/2022 Left 06/24/2022  Shoulder flexion 5 5  Shoulder abduction (C5)      Shoulder ER      Shoulder IR      Middle trapezius 4 4  Lower trapezius 3+ 3  Shoulder extension      Grip strength      Shoulder shrug (C4)      Elbow  flexion (C6)      Elbow ext (C7)      Thumb ext (C8)      Finger abd (T1)      Grossly        (Blank rows = not tested, score listed is out of 5 possible points.  N = WNL, D = diminished, C = clear for gross weakness with myotome testing, * = concordant pain with testing)      LUMBAR SPECIAL TESTS:  Straight leg raise: L (-), R (-) Slump: L (-), R (-)       Functional Tests   Eval (06/24/2022)                                                                                                               PALPATION:            TTP around TL junction, R UT, R LS, R sub occipitals   PATIENT SURVEYS:  FOTO 65 -> 68     TODAY'S TREATMENT      PATIENT EDUCATION:  POC, diagnosis, prognosis, HEP, and outcome measures.  Pt educated via explanation, demonstration, and handout (HEP).  Pt confirms understanding verbally.    HOME EXERCISE PROGRAM: Access Code: [email or web address] URL: [email or web address] Date: 06/24/2022 Prepared by: Shearon Balo   Exercises - Sidelying Thoracic Rotation with Open Book  - 1 x daily - 7 x weekly - 1 sets - 20 reps - 2 hold   Treatment priorities     Eval (06/24/2022)              TDN/manual              Thoracic rotation              Periscapular strengthening                                               TREATMENT 07/16/22:  Therapeutic Exercise: - UBE 2.5'/2.5' fwd and backward for warm up while taking subjective -  Foam roller routine for thoracic mobility - including protraction/retraction (unilateral and bilateral), cc/cw circles, horizontal abduction, shoulder flexion/ext alternating, shoulder abduction, thread the needle, and thoracic ext - supine horizontal abd - RTB - 3x10 - supine diagonals - RTB - 3x10 - scaption at wall - 2# - 3x10 - 90-90 OH press with W - 3x10 - unilateral row - alternating - 10# - 20x     ASSESSMENT:   CLINICAL IMPRESSION: Everett tolerated session well with no adverse reaction.   Concentrated on thoracic and scapulothoracic mobility combined with light strengthening (pt had recent tooth extraction so overall load kept low).  Likely close to D/C with minimal difficulty at work at this point.   OBJECTIVE IMPAIRMENTS: Pain, lumbar ROM, pain with thoracic flexion, periscapular strength   ACTIVITY LIMITATIONS: working and recreation without pain   PERSONAL FACTORS: See medical history and pertinent history     REHAB POTENTIAL: Good   CLINICAL DECISION MAKING: Stable/uncomplicated   EVALUATION COMPLEXITY: Low     GOALS:     SHORT TERM GOALS: Target date: 07/22/2022   Elyanah will be >75% HEP compliant to improve carryover between sessions and facilitate independent management of condition   Evaluation (06/24/2022): ongoing Goal status: MET     LONG TERM GOALS: Target date: 08/19/2022   Learah will improve FOTO score to 68 as a proxy for functional improvement   Evaluation/Baseline (06/24/2022): 65 Goal status: INITIAL   2.  Ezmariah will self report >/= 75% decrease in pain from evaluation    Evaluation/Baseline (06/24/2022): 7/10 max pain Goal status: INITIAL     3.  Sherona will be able to return to work full duty, not limited by pain   Evaluation/Baseline (06/24/2022): limited Goal status: INITIAL     4.  Ladiamond will be able to lift 30 lbs from the floor and place on a 3 foot counter, not limited by pain    Evaluation/Baseline (06/24/2022): unable Goal status: INITIAL     PLAN: PT FREQUENCY: 1-2x/week   PT DURATION: 8 weeks (Ending 08/19/2022)   PLANNED INTERVENTIONS: Therapeutic exercises, Aquatic therapy, Therapeutic activity, Neuro Muscular re-education, Gait training, Patient/Family education, Joint mobilization, Dry Needling, Electrical stimulation, Spinal mobilization and/or manipulation, Moist heat, Taping, Vasopneumatic device, Ionotophoresis 4mg /ml Dexamethasone, and Manual therapy   Kevan Ny Jamien Casanova PT 07/16/2022, 10:45 AM

## 2022-07-20 ENCOUNTER — Ambulatory Visit: Payer: PRIVATE HEALTH INSURANCE

## 2022-07-22 ENCOUNTER — Ambulatory Visit: Payer: PRIVATE HEALTH INSURANCE

## 2022-07-23 ENCOUNTER — Other Ambulatory Visit (HOSPITAL_COMMUNITY): Payer: Self-pay

## 2022-07-31 ENCOUNTER — Other Ambulatory Visit: Payer: Self-pay

## 2022-07-31 ENCOUNTER — Emergency Department (HOSPITAL_COMMUNITY)
Admission: EM | Admit: 2022-07-31 | Discharge: 2022-07-31 | Disposition: A | Discharge status: Home / Self Care | Payer: Commercial Managed Care - PPO | Attending: Emergency Medicine | Admitting: Emergency Medicine

## 2022-07-31 ENCOUNTER — Emergency Department (HOSPITAL_COMMUNITY): Payer: Commercial Managed Care - PPO

## 2022-07-31 DIAGNOSIS — R110 Nausea: Secondary | ICD-10-CM | POA: Insufficient documentation

## 2022-07-31 DIAGNOSIS — R0789 Other chest pain: Secondary | ICD-10-CM | POA: Insufficient documentation

## 2022-07-31 DIAGNOSIS — R519 Headache, unspecified: Secondary | ICD-10-CM | POA: Insufficient documentation

## 2022-07-31 DIAGNOSIS — R42 Dizziness and giddiness: Secondary | ICD-10-CM | POA: Insufficient documentation

## 2022-07-31 DIAGNOSIS — R739 Hyperglycemia, unspecified: Secondary | ICD-10-CM | POA: Insufficient documentation

## 2022-07-31 DIAGNOSIS — R079 Chest pain, unspecified: Secondary | ICD-10-CM | POA: Diagnosis not present

## 2022-07-31 DIAGNOSIS — R1013 Epigastric pain: Secondary | ICD-10-CM | POA: Diagnosis not present

## 2022-07-31 LAB — URINALYSIS, ROUTINE W REFLEX MICROSCOPIC
Bacteria, UA: NONE SEEN
Bilirubin Urine: NEGATIVE
Glucose, UA: NEGATIVE mg/dL
Hgb urine dipstick: NEGATIVE
Ketones, ur: NEGATIVE mg/dL
Nitrite: NEGATIVE
Protein, ur: NEGATIVE mg/dL
Specific Gravity, Urine: 1.003 — ABNORMAL LOW (ref 1.005–1.030)
pH: 8 (ref 5.0–8.0)

## 2022-07-31 LAB — COMPREHENSIVE METABOLIC PANEL
ALT: 22 U/L (ref 0–44)
AST: 29 U/L (ref 15–41)
Albumin: 4.1 g/dL (ref 3.5–5.0)
Alkaline Phosphatase: 60 U/L (ref 38–126)
Anion gap: 12 (ref 5–15)
BUN: 9 mg/dL (ref 6–20)
CO2: 25 mmol/L (ref 22–32)
Calcium: 9.5 mg/dL (ref 8.9–10.3)
Chloride: 101 mmol/L (ref 98–111)
Creatinine, Ser: 0.79 mg/dL (ref 0.44–1.00)
GFR, Estimated: >60 mL/min (ref >60)
Glucose, Bld: 114 mg/dL — ABNORMAL HIGH (ref 70–99)
Potassium: 3.7 mmol/L (ref 3.5–5.1)
Sodium: 138 mmol/L (ref 135–145)
Total Bilirubin: 1 mg/dL (ref 0.3–1.2)
Total Protein: 7.7 g/dL (ref 6.5–8.1)

## 2022-07-31 LAB — CBC WITH DIFFERENTIAL/PLATELET
Abs Immature Granulocytes: 0.02 10*3/uL (ref 0.00–0.07)
Basophils Absolute: 0 10*3/uL (ref 0.0–0.1)
Basophils Relative: 1 %
Eosinophils Absolute: 0 10*3/uL (ref 0.0–0.5)
Eosinophils Relative: 0 %
HCT: 39.5 % (ref 36.0–46.0)
Hemoglobin: 14.3 g/dL (ref 12.0–15.0)
Immature Granulocytes: 0 %
Lymphocytes Relative: 21 %
Lymphs Abs: 1.5 10*3/uL (ref 0.7–4.0)
MCH: 33.7 pg (ref 26.0–34.0)
MCHC: 36.2 g/dL — ABNORMAL HIGH (ref 30.0–36.0)
MCV: 93.2 fL (ref 80.0–100.0)
Monocytes Absolute: 0.3 10*3/uL (ref 0.1–1.0)
Monocytes Relative: 4 %
Neutro Abs: 5.6 10*3/uL (ref 1.7–7.7)
Neutrophils Relative %: 74 %
Platelets: 305 10*3/uL (ref 150–400)
RBC: 4.24 MIL/uL (ref 3.87–5.11)
RDW: 10.8 % — ABNORMAL LOW (ref 11.5–15.5)
WBC: 7.5 10*3/uL (ref 4.0–10.5)
nRBC: 0 % (ref 0.0–0.2)

## 2022-07-31 LAB — TROPONIN I (HIGH SENSITIVITY)
Troponin I (High Sensitivity): 5 ng/L (ref <18)
Troponin I (High Sensitivity): 4 ng/L (ref <18)

## 2022-07-31 LAB — POC OCCULT BLOOD, ED: Fecal Occult Bld: NEGATIVE

## 2022-07-31 LAB — MAGNESIUM: Magnesium: 1.8 mg/dL (ref 1.7–2.4)

## 2022-07-31 LAB — LIPASE, BLOOD: Lipase: 37 U/L (ref 11–51)

## 2022-07-31 MED ORDER — ALUM & MAG HYDROXIDE-SIMETH 200-200-20 MG/5ML PO SUSP
15.0000 mL | Freq: Once | ORAL | Status: AC
  Administered 2022-07-31: 15 mL via ORAL
  Filled 2022-07-31: qty 30

## 2022-07-31 MED ORDER — SUCRALFATE 1 G PO TABS: 1.0000 g | ORAL_TABLET | Freq: Three times a day (TID) | ORAL | 0 refills | Status: DC | PRN

## 2022-07-31 MED ORDER — LACTATED RINGERS IV BOLUS
1000.0000 mL | Freq: Once | INTRAVENOUS | Status: AC
  Administered 2022-07-31: 1000 mL via INTRAVENOUS

## 2022-07-31 MED ORDER — METOCLOPRAMIDE HCL 5 MG/ML IJ SOLN
10.0000 mg | Freq: Once | INTRAMUSCULAR | Status: AC
  Administered 2022-07-31: 10 mg via INTRAVENOUS
  Filled 2022-07-31: qty 2

## 2022-07-31 MED ORDER — LORAZEPAM 1 MG PO TABS
1.0000 mg | ORAL_TABLET | Freq: Once | ORAL | Status: AC
  Administered 2022-07-31: 1 mg via ORAL
  Filled 2022-07-31: qty 1

## 2022-07-31 MED ORDER — PANTOPRAZOLE SODIUM 40 MG IV SOLR
40.0000 mg | Freq: Once | INTRAVENOUS | Status: AC
  Administered 2022-07-31: 40 mg via INTRAVENOUS
  Filled 2022-07-31: qty 10

## 2022-07-31 MED ORDER — KETOROLAC TROMETHAMINE 15 MG/ML IJ SOLN
15.0000 mg | Freq: Once | INTRAMUSCULAR | Status: AC
  Administered 2022-07-31: 15 mg via INTRAVENOUS
  Filled 2022-07-31: qty 1

## 2022-07-31 NOTE — Discharge Instructions (Addendum)
You were seen in the ER today for evaluation of your epigastric pain, lightheadedness, and headache.  Your lab work was overall unremarkable.  Your imaging and EKG were unremarkable as well.  This is likely a worsening of your acid reflux.  I am going to prescribe you medication called Carafate which she will take 3 times daily with meals as needed.  Please continue taking your other medications.  Make sure you call your GI doctor to schedule an appointment with them know of your symptoms.  Also recommend following up with your primary care doctor.  If you have any concerns, new or worsening symptoms, please return to the nearest emergency department for evaluation.  Contact a doctor if: Your belly pain changes or gets worse. You are not hungry, or you lose weight without trying. You are having trouble pooping (constipated) or have watery poop (diarrhea) for more than 2-3 days. You have pain when you pee or poop. Your belly pain wakes you up at night. Your pain gets worse with meals, after eating, or with certain foods. You are vomiting and cannot keep anything down. You have a fever. You have blood in your pee. Get help right away if: Your pain does not go away as soon as your doctor says it should. You cannot stop vomiting. Your pain is only in areas of your belly, such as the right side or the left lower part of the belly. You have bloody or black poop, or poop that looks like tar. You have very bad pain, cramping, or bloating in your belly. You have signs of not having enough fluid or water in your body (dehydration), such as: Dark pee, very little pee, or no pee. Cracked lips. Dry mouth. Sunken eyes. Sleepiness. Weakness. You have trouble breathing or chest pain.

## 2022-07-31 NOTE — ED Triage Notes (Addendum)
Pt. Stated, [name] had burning sensation in my chest it really started bad on Tuesday after eating chinese food.  I have bad indigestion. Im also having some dizziness.  Pt. Took a zofran around 500 am this morning for nausea.

## 2022-07-31 NOTE — ED Notes (Signed)
Patient transported to X-ray 

## 2022-07-31 NOTE — ED Notes (Signed)
AVS reviewed with pt prior to discharge. Pt verbalizes understanding. Belongings with pt upon depart. Pt taken to POV via wheelchair with husband.

## 2022-07-31 NOTE — ED Provider Notes (Signed)
[place] [facility] DEPARTMENT AT [facility] Provider Note   CSN: [id number] Arrival date & time: 07/31/22  7829     History Chief Complaint  Patient presents with   Chest Pain   Dizziness   Nausea    [name] is a 57 y.o. female with history of acid reflux, hyperlipidemia, rectocele presents the emergency room today for evaluation of chest pain/epigastric pain and dizziness since Tuesday night.  Patient reports this all started after eating [place] food with nausea, dizziness and lightheadedness symptoms.  She took 6 some Pepcid and went to sleep.  She reports that she woke up Wednesday morning feeling a gnawing pain with some room spinning sensation.  She tried some bland diet and had nausea with 4-5 episodes of watery, foul-smelling diarrhea that was not bilious or black or bloody.  She reports that she tried taking Pepcid, Nexium, and Tagamet without much relief of her symptoms.  Her dizziness is only associated with her symptoms.  She reports that she had a regular bowel movement this morning but last night after eating some meat loaf was when her pain worsened that it has been since.  She has not had any diarrhea however.  Yesterday, she reports that she felt better and did not have any symptoms.  She denies any dysuria, hematuria, hematochezia, melena, chest pain, shortness of breath.  Reports a frontal headache with some dizziness and lightheadedness that is worse with head movement.  Denies any syncope.  She reports that she just stopped taking amoxicillin on the day that her symptoms started.  She is concerned as she recently had some dental procedures that she had endocarditis.  She does not have any history of endocarditis.  She is allergic to codeine.  Denies any tobacco, EtOH illicit drug use.   Chest Pain Associated symptoms: abdominal pain, dizziness, headache and nausea   Associated symptoms: no cough, no fever, no palpitations, no shortness of breath, no  vomiting and no weakness   Dizziness Associated symptoms: chest pain, diarrhea, headaches and nausea   Associated symptoms: no palpitations, no shortness of breath, no vomiting and no weakness        Home Medications Prior to Admission medications   Medication Sig Start Date End Date Taking? Authorizing Provider  sucralfate (CARAFATE) 1 g tablet Take 1 tablet (1 g total) by mouth 3 (three) times daily with meals as needed (as needed). Patient taking differently: Take 1 g by mouth 4 (four) times daily -  with meals and at bedtime. 07/31/22  Yes Achille Rich, PA-C  Armodafinil 150 MG tablet Take 1 tablet (150 mg total) by mouth daily. Patient taking differently: Take 150 mg by mouth as needed. 10/23/21     calcium-vitamin D (OSCAL WITH D) 500-200 MG-UNIT per tablet Take 1 tablet by mouth as needed.    [provider]  esomeprazole (NEXIUM) 40 MG capsule Take 1 capsule (40 mg total) by mouth daily before breakfast. 10/23/21     Estradiol 10 MCG TABS vaginal tablet Place 1 tablet (10 mcg total) vaginally 2 (two) times a week. 08/13/21     ezetimibe (ZETIA) 10 MG tablet Take 1 tablet (10 mg total) by mouth daily. 04/27/22     famotidine (PEPCID) 20 MG tablet  02/11/19   [provider]  fish oil-omega-3 fatty acids 1000 MG capsule Take 100 mg by mouth 2 (two) times daily.    [provider]  fluticasone Aleda Grana) 50 MCG/ACT nasal spray  07/12/18  [provider]  Lifitegrast Benay Spice) 5 % SOLN Instill 1 drop into both eyes twice a day 05/24/22     loratadine (CLARITIN) 10 MG tablet  07/12/18   [provider]  meloxicam (MOBIC) 15 MG tablet Take 1 tablet (15 mg total) by mouth daily. Patient taking differently: Take 1 tablet by mouth as needed. 10/15/20     methocarbamol (ROBAXIN) 500 MG tablet Take 500 mg by mouth as needed. 04/27/22   [provider]  ondansetron (ZOFRAN-ODT) 4 MG disintegrating tablet Take 4 mg by mouth as needed. 06/30/22   [provider]  pravastatin (PRAVACHOL) 80 MG tablet Take 1 tablet (80 mg total) by mouth Nightly. 10/23/21     traMADol (ULTRAM) 50 MG tablet Take 1 tablet (50 mg total) by mouth every 6 (six) hours as needed for pain after surgery 10/13/21         Allergies    Codeine    Review of Systems   Review of Systems  Constitutional:  Negative for chills and fever.  HENT:  Negative for congestion, rhinorrhea and sore throat.   Eyes:  Negative for photophobia and visual disturbance.  Respiratory:  Negative for cough and shortness of breath.   Cardiovascular:  Positive for chest pain. Negative for palpitations.  Gastrointestinal:  Positive for abdominal pain, diarrhea and nausea. Negative for constipation and vomiting.  Genitourinary:  Negative for dysuria and hematuria.  Musculoskeletal:  Negative for neck pain and neck stiffness.  Neurological:  Positive for dizziness, light-headedness and headaches. Negative for syncope and weakness.    Physical Exam Updated Vital Signs BP 108/68 (BP Location: Right Arm)   Pulse (!) 54   Temp 98.2 F (36.8 C) (Oral)   Resp 15   Ht 5\' 5"  (1.651 m)   Wt 73.5 kg   LMP 05/28/2010   SpO2 94%   BMI 26.96 kg/m  Physical Exam Vitals and nursing note reviewed.  Constitutional:      Appearance: She is not toxic-appearing.     Comments: Tearful, anxious, but not toxic.   HENT:     Head: Normocephalic and atraumatic.  Eyes:     Extraocular Movements: Extraocular movements intact.     Pupils: Pupils are equal, round, and reactive to light.  Cardiovascular:     Rate and Rhythm: Normal rate and regular rhythm.     Pulses:          Radial pulses are 2+ on the right side and 2+ on the left side.       Dorsalis pedis pulses are 2+ on the right side and 2+ on the left side.       Posterior tibial pulses are 2+ on the right side and 2+ on the left side.  Pulmonary:     Effort: Pulmonary effort is normal. No tachypnea, accessory muscle usage or respiratory  distress.     Breath sounds: No decreased breath sounds, wheezing or rhonchi.  Chest:     Chest wall: Tenderness present. No edema.  Abdominal:     General: Bowel sounds are normal.     Palpations: Abdomen is soft.     Tenderness: There is abdominal tenderness. There is no guarding or rebound.     Comments: Gastric tenderness to palpation.  No guarding or rebound.  Normal active bowel sounds.  Musculoskeletal:     Cervical back: Normal range of motion.     Right lower leg: No tenderness. No edema.     Left lower  leg: No tenderness. No edema.  Lymphadenopathy:     Cervical: No cervical adenopathy.  Skin:    General: Skin is warm and dry.     Capillary Refill: Capillary refill takes less than 2 seconds.  Neurological:     General: No focal deficit present.     Mental Status: She is alert. She is disoriented.     GCS: GCS eye subscore is 4. GCS verbal subscore is 5. GCS motor subscore is 6.     Cranial Nerves: Cranial nerves 2-12 are intact. No cranial nerve deficit, dysarthria or facial asymmetry.     Sensory: No sensory deficit.     Motor: No weakness or pronator drift.     Coordination: Finger-Nose-Finger Test normal.     ED Results / Procedures / Treatments   Labs (all labs ordered are listed, but only abnormal results are displayed) Labs Reviewed  CBC WITH DIFFERENTIAL/PLATELET - Abnormal; Notable for the following components:      Result Value   MCHC 36.2 (*)    RDW 10.8 (*)    All other components within normal limits  COMPREHENSIVE METABOLIC PANEL - Abnormal; Notable for the following components:   Glucose, Bld 114 (*)    All other components within normal limits  URINALYSIS, ROUTINE W REFLEX MICROSCOPIC - Abnormal; Notable for the following components:   Color, Urine STRAW (*)    Specific Gravity, Urine 1.003 (*)    Leukocytes,Ua TRACE (*)    All other components within normal limits  LIPASE, BLOOD  MAGNESIUM  POC OCCULT BLOOD, ED  TROPONIN I (HIGH SENSITIVITY)   TROPONIN I (HIGH SENSITIVITY)    EKG EKG Interpretation  Date/Time:  Saturday July 31 2022 09:40:01 EDT Ventricular Rate:  63 PR Interval:  150 QRS Duration: 92 QT Interval:  404 QTC Calculation: 414 R Axis:   73 Text Interpretation: Sinus rhythm No acute changes No old tracing to compare normal intervals Confirmed by Derwood Kaplan [phone]) on 07/31/2022 11:08:59 AM  Radiology DG Chest 2 View  Result Date: 07/31/2022 CLINICAL DATA:  Chest pain, dizziness, and nausea for 5 days. EXAM: CHEST - 2 VIEW COMPARISON:  None Available. FINDINGS: The heart size and mediastinal contours are within normal limits. Both lungs are clear. The visualized skeletal structures are unremarkable. IMPRESSION: No active cardiopulmonary disease. Electronically Signed   By: Danae Orleans M.D.   On: 07/31/2022 10:09     Procedures Procedures    Medications Ordered in ED Medications  pantoprazole (PROTONIX) injection 40 mg (40 mg Intravenous Given 07/31/22 1033)  alum & mag hydroxide-simeth (MAALOX/MYLANTA) 200-200-20 MG/5ML suspension 15 mL (15 mLs Oral Given 07/31/22 1033)  metoCLOPramide (REGLAN) injection 10 mg (10 mg Intravenous Given 07/31/22 1033)  lactated ringers bolus 1,000 mL (0 mLs Intravenous Stopped 07/31/22 1126)  LORazepam (ATIVAN) tablet 1 mg (1 mg Oral Given 07/31/22 1125)  ketorolac (TORADOL) 15 MG/ML injection 15 mg (15 mg Intravenous Given 07/31/22 1204)    ED Course/ Medical Decision Making/ A&P                           Medical Decision Making Amount and/or Complexity of Data Reviewed Labs: ordered. Radiology: ordered.  Risk OTC drugs. Prescription drug management.    57 y.o. female presents to the ER for evaluation of chest pain/epigastric pain, diarrhea, lightheadedness and dizziness for the past 4-5 days intermittently. Differential diagnosis includes but is not limited to ACS, pericarditis, myocarditis, aortic dissection, PE, pneumothorax,  esophageal spasm or rupture, chronic  angina, pneumonia, bronchitis, GERD, reflux/PUD, biliary disease, pancreatitis, costochondritis, anxiety, BPPV, vestibular migraine, head trauma, AVM, intracranial tumor, multiple sclerosis, drug-related, CVA, vasovagal syncope, orthostatic hypotension, sepsis, hypoglycemia, electrolyte disturbance, anemia, anxiety/panic attack. Vital signs mild hypertension otherwise unremarkable. Physical exam as noted above.   Patient has some chest tenderness palpation as well as some epigastric tenderness palpation.  Abdomen is soft without any guarding or rebound.  There is normal active bowel sounds.  Very mild to palpation.  She does not have any right upper quadrant tenderness or any positive Murphy sign.  Likely acid reflux or some gastritis from food poisoning.  I do not think any imaging is needed at this time of her belly.  Additionally, her neurology exam is unremarkable.  She could be having some anxiety that is causing the headaches and the lightheadedness and dizziness given that this only occurs with her pain.  Will treat with some Ativan and migraine cocktail for her symptoms and reevaluate.  My attending assessed at bedside and agrees with treatment plan.  I independently reviewed and interpreted the patient's labs.  Magnesium within normal limits.  CMP shows mildly elevated glucose of 114 otherwise no electrolyte or LFT abnormality.  CBC without leukocytosis or anemia.  Lipase within normal limits.  Fecal occult is negative.  Urinalysis shows straw-colored urine is dilute with trace leukocytes otherwise no signs of infection or dehydration.  Initial troponin is 5 with repeat of 4.  Chest x-ray shows no acute cardiopulmonary process.  EKG reviewed and interpreted to my attending and read as sinus rhythm.  Overall, the patient was given Protonix, Reglan, Maalox, Ativan, Toradol, and 1 L of LR.  On reevaluation, patient is lying comfortably on the stretcher.  When she awakes she reports that her pain is  decreased from 8 out of 10 to a 1-2 out of 10.  She reports that she is feeling much better.  The patient has a nonfocal exam.  Cranial nerves II through XII are intact, no pronator drift, strength is 5 out of 5 in patient's upper and lower bilateral extremities.  She only has his dizziness and lightheadedness feeling with the pain, could be pain response.  She is felt relief with the other medications given to her for her abdominal pain.  Again, there is no rebounding or guarding, do not think any other imaging is needed there for abdomen or head.  Mobilization for any TIA or any stroke.  Likely some anxiety component to this.  Nothing to indicate any endocarditis.  She does not have any Osler or Janeway spots.  My attending recommends Carafate and discharged home.  After consideration of the diagnostic results and the patients response to treatment, I feel that patient is stable for discharge with close PCP follow-up and supportive care measures with Carafate and bland diet.   We discussed the results of the labs/imaging. The plan is follow-up with PCP, Carafate, bland diet. We discussed strict return precautions and red flag symptoms. The patient verbalized their understanding and agrees to the plan. The patient is stable and being discharged home in good condition.  I discussed this case with my attending physician who cosigned this note including patient's presenting symptoms, physical exam, and planned diagnostics and interventions. Attending physician stated agreement with plan or made changes to plan which were implemented.   Attending physician assessed patient at bedside.  Portions of this report may have been transcribed using voice recognition software. Every effort was made  to ensure accuracy; however, inadvertent computerized transcription errors may be present.    Final Clinical Impression(s) / ED Diagnoses Final diagnoses:  Epigastric pain  Nausea  Lightheadedness  Bad headache     Rx / DC Orders ED Discharge Orders          Ordered    sucralfate (CARAFATE) 1 g tablet  3 times daily with meals PRN        07/31/22 1531              Achille RichRansom, Loretta Kluender, PA-C 08/05/22 0001    Derwood KaplanNanavati, Ankit, MD 08/10/22 0028

## 2022-08-01 ENCOUNTER — Telehealth: Payer: Self-pay

## 2022-08-01 NOTE — Telephone Encounter (Signed)
Patient and boyfriend called in to state that the pharmacy where her carafate was called is closed until Monday. Request medication to be called to [facility] on [address] [place]. Called in as [facility]tten to [facility].

## 2022-08-02 DIAGNOSIS — R1084 Generalized abdominal pain: Secondary | ICD-10-CM | POA: Diagnosis not present

## 2022-08-02 DIAGNOSIS — R109 Unspecified abdominal pain: Secondary | ICD-10-CM | POA: Diagnosis not present

## 2022-08-02 DIAGNOSIS — R1013 Epigastric pain: Secondary | ICD-10-CM | POA: Diagnosis not present

## 2022-08-03 ENCOUNTER — Encounter: Payer: Self-pay | Admitting: Gastroenterology

## 2022-08-03 ENCOUNTER — Ambulatory Visit: Payer: Commercial Managed Care - PPO | Admitting: Gastroenterology

## 2022-08-03 VITALS — BP 100/74 | HR 76 | Ht 64.5 in | Wt 161.2 lb

## 2022-08-03 DIAGNOSIS — Z860100 Personal history of colon polyps, unspecified: Secondary | ICD-10-CM

## 2022-08-03 DIAGNOSIS — R12 Heartburn: Secondary | ICD-10-CM | POA: Diagnosis not present

## 2022-08-03 DIAGNOSIS — R1013 Epigastric pain: Secondary | ICD-10-CM

## 2022-08-03 NOTE — Progress Notes (Signed)
[facility] Consult Note:  History: [name] 08/03/2022  Referring provider: Hadley Pen, MD  Reason for consult/chief complaint: Nausea, Gastroesophageal Reflux (Started Tueday after eating [place] food had nausea dizziness, and burning, had ED visit 07/31/22), Diarrhea (Twice during this period), and Abdominal Pain (Epigastric pain, Started Tueday after eating [place] food had nausea and burning, had ED visit 07/31/22/)   Subjective  HPI: [name] was referred to see [place] after an ED visit 3 days ago, when she had acute onset of nausea, heartburn epigastric pain and diarrhea after eating Asian food from a restaurant. (Provider note from the PA who saw her in the ED that day is incomplete and not finalized)  She was referred by primary care (and recommended to [place] by a friend who is one of our endoscopy nurses). Review of epic GI encounters indicates that she may have seen Dr. Ewing Schlein at [facility] in August 2017 for epigastric pain, melena, family history of colon polyps.  Those procedure reports are not available, but [name] recalls having an upper endoscopy and colonoscopy for history of chronic intermittent heartburn and for colon cancer screening.  She believes the upper scope was normal and some colon polyps were removed.  She is due to have a colonoscopy sometime this year but not yet gotten around to contacting [facility].  She is here today for severe heartburn that has been occurring for about a week. In mid to late March she had 2 dental abscesses requiring root canal as well as antibiotics and ibuprofen.  A week ago she had Asian food (sesame chicken) at [employer] out of town, and awoke that evening with severe heartburn and burning epigastric discomfort with nausea.  She also felt lightheaded and dizzy.  She took some antacids and symptoms subsided to the following day, but recurred on and off in the next few days, worsening the day of ED visit over this past  weekend. She has never experienced anything like this before, and was concerned this could be gallbladder related or somehow related to recent dental abscesses. She denies dysphagia or odynophagia.  Heartburn is subsiding.  There were 2 episodes of loose nonbloody stool and this last week.  Currently on Nexium 40 mg in the morning, Pepcid in the evening added yesterday by primary care, no NSAIDs at present.  She was also prescribed Carafate after the ED visit and is taking 1 tablet 4 times a day.  Heartburn is subsiding in the last couple of days. ROS:  Review of Systems  Constitutional:  Negative for appetite change and unexpected weight change.  HENT:  Negative for mouth sores and voice change.   Eyes:  Negative for pain and redness.  Respiratory:  Negative for cough and shortness of breath.   Cardiovascular:  Negative for chest pain and palpitations.  Genitourinary:  Negative for dysuria and hematuria.  Musculoskeletal:  Positive for arthralgias. Negative for myalgias.  Skin:  Negative for pallor and rash.  Allergic/Immunologic: Positive for environmental allergies.  Neurological:  Negative for weakness and headaches.  Hematological:  Negative for adenopathy.     Past Medical History: Past Medical History:  Diagnosis Date   Arthritis    GERD (gastroesophageal reflux disease)    Gestational diabetes    Headache    Hyperlipidemia    PONV (postoperative nausea and vomiting)    Seasonal allergies    Seizure    after taking sudafed - never proven     Past Surgical History:  Past Surgical History:  Procedure Laterality Date   BLEPHAROPLASTY Bilateral    BREAST ENHANCEMENT SURGERY  1991   breast implants    COLONOSCOPY     COLONOSCOPY WITH PROPOFOL N/A 01/27/2016   Procedure: COLONOSCOPY WITH PROPOFOL;  Surgeon: Vida RiggerMarc Magod, MD;  [facility];  Service: Endoscopy;  Laterality: N/A;   ESOPHAGOGASTRODUODENOSCOPY (EGD) WITH PROPOFOL N/A 01/27/2016   Procedure:  ESOPHAGOGASTRODUODENOSCOPY (EGD) WITH PROPOFOL;  Surgeon: Vida RiggerMarc Magod, MD;  [facility];  Service: Endoscopy;  Laterality: N/A;   LIPOSUCTION     reverse tummy tuck     VAGINAL HYSTERECTOMY  11/07/2002   Total vaginal hysterectomy, anterior and posterior colporrhaphy,  TVT procedure, cystoscopy.     Family History: Family History  Problem Relation Age of Onset   Diabetes Mellitus II Mother    Sleep apnea Mother    Hypertension Mother    High Cholesterol Mother    Stroke Mother    Hypertension Father    High Cholesterol Father    Alcoholism Father    Cancer Maternal Grandmother        Biliary Cancer   Colon polyps Maternal Grandfather    Diabetes Maternal Grandfather    Alcoholism Son    Breast cancer Maternal Aunt    Hypertension Other    Hyperlipidemia Other     Social History: Social History   Socioeconomic History   Marital status: Significant Other    Spouse name: Not on file   Number of children: 1   Years of education: Not on file   Highest education level: Not on file  Occupational History   Occupation: RN  Tobacco Use   Smoking status: Never   Smokeless tobacco: Never  Vaping Use   Vaping Use: Never used  Substance and Sexual Activity   Alcohol use: No   Drug use: No   Sexual activity: Not Currently    Birth control/protection: Surgical    Comment: hysterectomy  Other Topics Concern   Not on file  Social History Narrative   Not on file   Social Determinants of Health   Financial Resource Strain: Not on file  Food Insecurity: Not on file  Transportation Needs: Not on file  Physical Activity: Not on file  Stress: Not on file  Social Connections: Not on file    Allergies: Allergies  Allergen Reactions   Codeine     Outpatient Meds: Current Outpatient Medications  Medication Sig Dispense Refill   Armodafinil 150 MG tablet Take 1 tablet (150 mg total) by mouth daily. (Patient taking differently: Take 150 mg by mouth as needed.)  30 tablet 3   calcium-vitamin D (OSCAL WITH D) 500-200 MG-UNIT per tablet Take 1 tablet by mouth as needed.     esomeprazole (NEXIUM) 40 MG capsule Take 1 capsule (40 mg total) by mouth daily before breakfast. 90 capsule 3   Estradiol 10 MCG TABS vaginal tablet Place 1 tablet (10 mcg total) vaginally 2 (two) times a week. 24 tablet 3   ezetimibe (ZETIA) 10 MG tablet Take 1 tablet (10 mg total) by mouth daily. 90 tablet 3   famotidine (PEPCID) 20 MG tablet      fish oil-omega-3 fatty acids 1000 MG capsule Take 100 mg by mouth 2 (two) times daily.     fluticasone (FLONASE) 50 MCG/ACT nasal spray      Lifitegrast (XIIDRA) 5 % SOLN Instill 1 drop into both eyes twice a day 180 each 3   loratadine (CLARITIN) 10 MG tablet  meloxicam (MOBIC) 15 MG tablet Take 1 tablet (15 mg total) by mouth daily. (Patient taking differently: Take 1 tablet by mouth as needed.) 90 tablet 0   methocarbamol (ROBAXIN) 500 MG tablet Take 500 mg by mouth as needed.     ondansetron (ZOFRAN-ODT) 4 MG disintegrating tablet Take 4 mg by mouth as needed.     pravastatin (PRAVACHOL) 80 MG tablet Take 1 tablet (80 mg total) by mouth Nightly. 90 tablet 3   sucralfate (CARAFATE) 1 g tablet Take 1 tablet (1 g total) by mouth 3 (three) times daily with meals as needed (as needed). (Patient taking differently: Take 1 g by mouth 4 (four) times daily -  with meals and at bedtime.) 30 tablet 0   traMADol (ULTRAM) 50 MG tablet Take 1 tablet (50 mg total) by mouth every 6 (six) hours as needed for pain after surgery 10 tablet 0   No current facility-administered medications for this visit.      ___________________________________________________________________ Objective   Exam:  BP 100/74 (BP Location: Left Arm, Patient Position: Sitting, Cuff Size: Normal)   Pulse 76   Ht 5' 4.5" (1.638 m) Comment: height measured without shoes  Wt 161 lb 4 oz (73.1 kg)   LMP 05/28/2010   BMI 27.25 kg/m  Wt Readings from Last 3 Encounters:   08/03/22 161 lb 4 oz (73.1 kg)  07/31/22 162 lb (73.5 kg)  05/27/22 168 lb (76.2 kg)  Elon Spanner, CMA present for entire exam  General: Well-appearing with normal vocal quality Eyes: sclera anicteric, no redness ENT: oral mucosa moist without lesions, no cervical or supraclavicular lymphadenopathy CV: Regular without appreciable murmur, no JVD, no peripheral edema Resp: clear to auscultation bilaterally, normal RR and effort noted GI: soft, mild epigastric tenderness, with active bowel sounds. No guarding or palpable organomegaly noted. Skin; warm and dry, no rash or jaundice noted Neuro: awake, alert and oriented x 3. Normal gross motor function and fluent speech Normal perianal exam, rectal swab performed for Diatherix stool H. pylori antigen (see below) Labs:     Latest Ref Rng & Units 07/31/2022   10:21 AM 09/14/2019   10:21 AM  CBC  WBC 4.0 - 10.5 K/uL 7.5  6.6   Hemoglobin 12.0 - 15.0 g/dL 37.2  90.2   Hematocrit 36.0 - 46.0 % 39.5  42.3   Platelets 150 - 400 K/uL 305  297       Latest Ref Rng & Units 07/31/2022   10:21 AM  CMP  Glucose 70 - 99 mg/dL 111   BUN 6 - 20 mg/dL 9   Creatinine 5.52 - 0.80 mg/dL 2.23   Sodium 361 - 224 mmol/L 138   Potassium 3.5 - 5.1 mmol/L 3.7   Chloride 98 - 111 mmol/L 101   CO2 22 - 32 mmol/L 25   Calcium 8.9 - 10.3 mg/dL 9.5   Total Protein 6.5 - 8.1 g/dL 7.7   Total Bilirubin 0.3 - 1.2 mg/dL 1.0   Alkaline Phos 38 - 126 U/L 60   AST 15 - 41 U/L 29   ALT 0 - 44 U/L 22    FOBT negative during ED visit  Troponin x 2 negative  EKG normal sinus rhythm, nonischemic  Radiologic Studies:  Normal 2 view chest x-ray  Assessment: Encounter Diagnoses  Name Primary?   Heartburn Yes   Epigastric pain    History of colonic polyps     Pre-existing intermittent and relatively mild heartburn, now about a week into  a severe flare that was subacute onset leading to an ED visit.  Ruled out for cardiac cause that day. I think this has  happened from a few triggers, first antibiotics entheses amoxicillin) and ibuprofen after dental work followed shortly afterward by probable foodborne trigger (some ingredient or perhaps infectious agent) from restaurant food as noted above. She and her PCP were concerned about H. pylori and she thought she may need an upper endoscopy at this point.  It would be unusual for H. pylori to cause symptoms rather abruptly and for them to primarily be heartburn.  Reasonable however to rule it out.  Cannot do breath test or typical stool antigen because she is on acid suppression, but we were able to do a Diatherix stool swab for H. pylori antigen today. I am not planning to schedule an upper endoscopy at this point. Plan:  I think this will gradually improve on its own, she will continue Nexium once a day and Pepcid at night for the next 7 to 10 days.  Carafate will work better if it is dissolved into a slurry rather than his tablets, particularly when we are dealing with heartburn.  She was given instructions on how to do that.  If her current supply runs out before she has resolution of symptoms, I will refill it at her request.  When this current episode is resolved, we can then talk about timing of surveillance colonoscopy.  She would like to transfer care to our clinic, so we will request prior endoscopic and pathology results from [facility].  Thank you for the courtesy of this consult.  Please call me with any questions or concerns.  Charlie Pitter III  CC: Referring provider noted above

## 2022-08-03 NOTE — Patient Instructions (Signed)
_______________________________________________________  If your blood pressure at your visit was 140/90 or greater, please contact your primary care physician to follow up on this.  _______________________________________________________  If you are age 57 or older, your body mass index should be between 23-30. Your Body mass index is 27.25 kg/m. If this is out of the aforementioned range listed, please consider follow up with your Primary Care Provider.  If you are age 21 or younger, your body mass index should be between 19-25. Your Body mass index is 27.25 kg/m. If this is out of the aformentioned range listed, please consider follow up with your Primary Care Provider.   ________________________________________________________  The [facility] providers would like to encourage you to use [facility] to communicate with providers for non-urgent requests or questions.  Due to long hold times on the telephone, sending your provider a message by [facility] may be a faster and more efficient way to get a response.  Please allow 48 business hours for a response.  Please remember that this is for non-urgent requests.  _______________________________________________________  Your provider has ordered "Diatherix" stool testing for you. You have received a kit from our office today containing all necessary supplies to complete this test. Please carefully read the stool collection instructions provided in the kit before opening the accompanying materials. In addition, be sure to place the label from the top left corner of the laboratory request sheet onto the "puritan opti-swab" tube that is supplied in the kit. This label should include your full name and date of birth. After completing the test, you should secure the purtian tube into the specimen biohazard bag. The laboratory request information sheet (including date and time of specimen collection) should be placed into the outside pocket of the specimen  biohazard bag and returned to the [facility] lab with 2 days of collection.    It was a pleasure to see you today!  Thank you for trusting me with your gastrointestinal care!

## 2022-08-05 ENCOUNTER — Encounter: Payer: Self-pay | Admitting: Gastroenterology

## 2022-09-02 ENCOUNTER — Telehealth: Payer: Self-pay | Admitting: Gastroenterology

## 2022-09-02 NOTE — Telephone Encounter (Signed)
Per OV note EGD was not planned at that time. Pt calling to schedule EGD and colon. States she is due for colon and would like to have EGD done to make sure everything is ok. She has still had some reflux but nothing like it was when she saw Dr. Myrtie Neither. Please advise if ok to schedule directly.

## 2022-09-02 NOTE — Telephone Encounter (Signed)
Patient called to schedule colonoscopy and endoscopy. States that it was discussed at her office visit on 04/09. Requesting a call back to see if this could be scheduled. Please advise, thank you.

## 2022-09-03 NOTE — Telephone Encounter (Signed)
If she is still having some heartburn, then I am agreeable to doing the EGD at the time of colonoscopy.  However, I still have not received the prior colonoscopy and pathology reports from [facility] to know for certain when she is due for her colonoscopy.  They were requested after her visit with me but not received.  I know that she recalls being due sometime this year, but I need to see the records because surveillance guidelines have changed in the last few years.  Please send another request for records, and I also recommend that she contact [facility] to help expedite that.  HD

## 2022-09-06 ENCOUNTER — Encounter: Payer: Self-pay | Admitting: Gastroenterology

## 2022-09-06 NOTE — Telephone Encounter (Signed)
Dr. Myrtie Neither, previous GI records are under Media for review.

## 2022-09-06 NOTE — Telephone Encounter (Signed)
I will send her a portal message about it.  Based on the available records from [facility], she is not due for colonoscopy yet.   - HD

## 2022-09-08 NOTE — Telephone Encounter (Signed)
Pt reviewed MyChart message with recommendations. Last read by Martina Sinner at 10:20 AM on 09/08/2022.

## 2022-09-15 NOTE — Telephone Encounter (Signed)
Please place colonoscopy recall for October 2027  - HD

## 2022-09-15 NOTE — Telephone Encounter (Signed)
Colonoscopy recall in epic for 01/2026

## 2022-09-27 ENCOUNTER — Telehealth: Payer: Self-pay

## 2022-09-27 NOTE — Telephone Encounter (Signed)
Received a fax from [facility] with 2017 EGD and colonoscopy reports. Reports have been placed in your office IN basket for review. Thanks

## 2022-10-11 ENCOUNTER — Other Ambulatory Visit (HOSPITAL_COMMUNITY): Payer: Self-pay

## 2022-10-12 ENCOUNTER — Other Ambulatory Visit (HOSPITAL_COMMUNITY): Payer: Self-pay

## 2022-10-13 ENCOUNTER — Other Ambulatory Visit (HOSPITAL_COMMUNITY): Payer: Self-pay

## 2022-10-21 ENCOUNTER — Other Ambulatory Visit (HOSPITAL_COMMUNITY): Payer: Self-pay

## 2022-11-04 ENCOUNTER — Other Ambulatory Visit (HOSPITAL_COMMUNITY): Payer: Self-pay

## 2022-11-22 ENCOUNTER — Other Ambulatory Visit (HOSPITAL_COMMUNITY): Payer: Self-pay

## 2022-11-22 DIAGNOSIS — Z0000 Encounter for general adult medical examination without abnormal findings: Secondary | ICD-10-CM | POA: Diagnosis not present

## 2022-11-22 DIAGNOSIS — K121 Other forms of stomatitis: Secondary | ICD-10-CM | POA: Diagnosis not present

## 2022-11-22 MED ORDER — ESOMEPRAZOLE MAGNESIUM 40 MG PO CPDR
40.0000 mg | DELAYED_RELEASE_CAPSULE | Freq: Every day | ORAL | 3 refills | Status: AC
  Filled 2022-11-22: qty 70, 70d supply, fill #0
  Filled 2023-03-07 – 2023-06-24 (×2): qty 90, 90d supply, fill #0
  Filled 2023-10-27: qty 90, 90d supply, fill #1

## 2022-11-22 MED ORDER — ESTRADIOL 10 MCG VA TABS
10.0000 ug | ORAL_TABLET | VAGINAL | 0 refills | Status: DC
  Filled 2022-11-22: qty 14, 49d supply, fill #0

## 2022-11-22 MED ORDER — PRAVASTATIN SODIUM 80 MG PO TABS
80.0000 mg | ORAL_TABLET | Freq: Every evening | ORAL | 3 refills | Status: AC
  Filled 2022-11-22 – 2023-06-24 (×3): qty 90, 90d supply, fill #0
  Filled 2023-09-20: qty 90, 90d supply, fill #1

## 2022-11-23 DIAGNOSIS — K121 Other forms of stomatitis: Secondary | ICD-10-CM | POA: Diagnosis not present

## 2022-11-23 DIAGNOSIS — Z0000 Encounter for general adult medical examination without abnormal findings: Secondary | ICD-10-CM | POA: Diagnosis not present

## 2022-11-25 ENCOUNTER — Ambulatory Visit: Payer: Commercial Managed Care - PPO | Admitting: Obstetrics and Gynecology

## 2023-01-25 DIAGNOSIS — H524 Presbyopia: Secondary | ICD-10-CM | POA: Diagnosis not present

## 2023-03-07 ENCOUNTER — Other Ambulatory Visit (HOSPITAL_COMMUNITY): Payer: Self-pay

## 2023-03-07 ENCOUNTER — Other Ambulatory Visit: Payer: Self-pay

## 2023-03-07 MED ORDER — MELOXICAM 15 MG PO TABS
15.0000 mg | ORAL_TABLET | Freq: Every day | ORAL | 0 refills | Status: AC
  Filled 2023-03-07: qty 90, 90d supply, fill #0

## 2023-03-07 MED ORDER — ARMODAFINIL 150 MG PO TABS
150.0000 mg | ORAL_TABLET | Freq: Every day | ORAL | 3 refills | Status: DC
  Filled 2023-03-07 – 2023-06-24 (×2): qty 30, 30d supply, fill #0

## 2023-03-14 ENCOUNTER — Other Ambulatory Visit (HOSPITAL_COMMUNITY): Payer: Self-pay

## 2023-04-08 ENCOUNTER — Other Ambulatory Visit (HOSPITAL_COMMUNITY): Payer: Self-pay

## 2023-04-08 MED ORDER — ESTRADIOL 10 MCG VA TABS
10.0000 ug | ORAL_TABLET | VAGINAL | 0 refills | Status: DC
  Filled 2023-04-08: qty 8, 28d supply, fill #0
  Filled 2023-05-12: qty 6, 21d supply, fill #0

## 2023-05-12 ENCOUNTER — Other Ambulatory Visit (HOSPITAL_BASED_OUTPATIENT_CLINIC_OR_DEPARTMENT_OTHER): Payer: Self-pay

## 2023-05-12 MED ORDER — ESTRADIOL 10 MCG VA TABS
ORAL_TABLET | VAGINAL | 0 refills | Status: DC
  Filled 2023-05-12: qty 8, 28d supply, fill #0

## 2023-05-19 ENCOUNTER — Ambulatory Visit: Payer: Commercial Managed Care - PPO | Admitting: Obstetrics and Gynecology

## 2023-05-19 ENCOUNTER — Encounter: Payer: Self-pay | Admitting: Obstetrics and Gynecology

## 2023-05-19 VITALS — BP 136/76 | HR 60 | Ht 64.5 in | Wt 167.0 lb

## 2023-05-19 DIAGNOSIS — R102 Pelvic and perineal pain unspecified side: Secondary | ICD-10-CM

## 2023-05-19 DIAGNOSIS — R3915 Urgency of urination: Secondary | ICD-10-CM | POA: Diagnosis not present

## 2023-05-19 DIAGNOSIS — N993 Prolapse of vaginal vault after hysterectomy: Secondary | ICD-10-CM

## 2023-05-19 DIAGNOSIS — R159 Full incontinence of feces: Secondary | ICD-10-CM | POA: Diagnosis not present

## 2023-05-19 LAB — POCT URINALYSIS DIPSTICK
Bilirubin, UA: NEGATIVE
Blood, UA: NEGATIVE
Glucose, UA: NEGATIVE
Ketones, UA: NEGATIVE
Leukocytes, UA: NEGATIVE
Nitrite, UA: NEGATIVE
Protein, UA: NEGATIVE
Spec Grav, UA: 1.015 (ref 1.010–1.025)
Urobilinogen, UA: 0.2 U/dL
pH, UA: 7 (ref 5.0–8.0)

## 2023-05-19 NOTE — Progress Notes (Signed)
New Patient Evaluation and Consultation  Referring Provider: Hadley Pen, MD PCP: Hadley Pen, MD Date of Service: 05/19/2023  SUBJECTIVE Chief Complaint: New Patient (Initial Visit) [name] is a 58 y.o. female here for a consult for a rectocele.)  History of Present Illness: [name] is a 58 y.o. White or Caucasian female presenting for evaluation of prolapse.    Review of records significant for: Has a prior history of TVH, A&P repair, TVT sling and cystoscopy in 2004 with Dr Edward Jolly. Second degree rectocele noted on recent exam with good anterior and apical support.   Urinary Symptoms: Occasional leakage with urgency.  History of prior sling.   Day time voids 5, does have some urgency.  Nocturia: 2 times per night to void. Voiding dysfunction:  empties bladder well.  Patient does not use a catheter to empty bladder.  When urinating, patient feels a weak stream Drinks: 1 small bottle Diet Dr pepper, water with Mio or IV hydration or collagen per day  UTIs:  0  UTI's in the last year.   Denies history of blood in urine and kidney or bladder stones No results found for the last 90 days.   Pelvic Organ Prolapse Symptoms:                  Patient Admits to a feeling of a bulge the vaginal area. It has been present for 2 years.  Patient Admits to seeing a bulge.  This bulge is bothersome. History of prior TVH with A&P repair  Bowel Symptom: Bowel movements: 1 time(s) per day or every other Stool consistency: soft  or loose Straining: no.  Splinting: no.  Incomplete evacuation: no.  Patient Admits to accidental bowel leakage / fecal incontinence  Occurs: 1-2 time(s) per week  Consistency with leakage: soft , small amount after wiping Bowel regimen: none  HM Colonoscopy          Upcoming     Colonoscopy (Every 10 Years) Next due on 01/26/2026    01/27/2016  Surgical Procedure: COLONOSCOPY WITH PROPOFOL   Only the first 1 history  entries have been loaded, but more history exists.                Sexual Function Sexually active: yes.  Sexual orientation:  heterosexual Pain with sex: No  Pelvic Pain Admits to pelvic pain Location: pubic area and anus Pain occurs: with prolapse Prior pain treatment: tylenol Improved by: medication and decreasing prolapse Worsened by: prolapse   Past Medical History:  Past Medical History:  Diagnosis Date   Arthritis    GERD (gastroesophageal reflux disease)    Gestational diabetes    Headache    Hyperlipidemia    PONV (postoperative nausea and vomiting)    Seasonal allergies    Seizure (HCC)    after taking sudafed - never proven     Past Surgical History:   Past Surgical History:  Procedure Laterality Date   BLEPHAROPLASTY Bilateral    BREAST ENHANCEMENT SURGERY  1991   breast implants    COLONOSCOPY     COLONOSCOPY WITH PROPOFOL N/A 01/27/2016   Procedure: COLONOSCOPY WITH PROPOFOL;  Surgeon: Vida Rigger, MD;  Location: [facility] ENDOSCOPY;  Service: Endoscopy;  Laterality: N/A;   ESOPHAGOGASTRODUODENOSCOPY (EGD) WITH PROPOFOL N/A 01/27/2016   Procedure: ESOPHAGOGASTRODUODENOSCOPY (EGD) WITH PROPOFOL;  Surgeon: Vida Rigger, MD;  Location: [facility] END[facility]COPY;  Service: Endoscopy;  Laterality: N/A;   LIPOSUCTION     reverse tummy tuck  VAGINAL HYSTERECTOMY  11/07/2002   Total vaginal hysterectomy, anterior and posterior colporrhaphy,  TVT procedure, cystoscopy.     Past OB/GYN History: OB History  Gravida Para Term Preterm AB Living  1 1 1   1   SAB IAB Ectopic Multiple Live Births      1    # Outcome Date GA Lbr Len/2nd Weight Sex Type Anes PTL Lv  1 Term      Vag-Forceps       S/p TVH  Medications: Patient has a current medication list which includes the following prescription(s): armodafinil, calcium-vitamin d, esomeprazole, estradiol, ezetimibe, famotidine, fish oil-omega-3 fatty acids, fluticasone, xiidra, loratadine, meloxicam, methocarbamol, and  pravastatin.   Allergies: Patient is allergic to codeine.   Social History:  Social History   Tobacco Use   Smoking status: Never   Smokeless tobacco: Never  Vaping Use   Vaping status: Never Used  Substance Use Topics   Alcohol use: No   Drug use: No    Relationship status: long-term partner Patient lives with boyfriend.   Patient is employed as an Charity fundraiser at [employer]. Regular exercise: Yes: pickleball and peloton bike History of abuse: No  Family History:   Family History  Problem Relation Age of Onset   Diabetes Mellitus II Mother    Sleep apnea Mother    Hypertension Mother    High Cholesterol Mother    Stroke Mother    Hypertension Father    High Cholesterol Father    Alcoholism Father    Cancer Maternal Grandmother        Biliary Cancer   Colon polyps Maternal Grandfather    Diabetes Maternal Grandfather    Alcoholism Son    Breast cancer Maternal Aunt    Hypertension Other    Hyperlipidemia Other      Review of Systems: Review of Systems  Constitutional:  Negative for fever, malaise/fatigue and weight loss.  Respiratory:  Negative for cough, shortness of breath and wheezing.   Cardiovascular:  Negative for chest pain, palpitations and leg swelling.  Gastrointestinal:  Negative for abdominal pain and blood in stool.  Genitourinary:  Negative for dysuria.  Musculoskeletal:  Negative for myalgias.  Skin:  Negative for rash.  Neurological:  Negative for dizziness and headaches.  Endo/Heme/Allergies:  Does not bruise/bleed easily.  Psychiatric/Behavioral:  Negative for depression. The patient is not nervous/anxious.      OBJECTIVE Physical Exam: Vitals:   05/19/23 0902  BP: 136/76  Pulse: 60  Weight: 167 lb (75.8 kg)  Height: 5' 4.5" (1.638 m)    Physical Exam Vitals reviewed. Exam conducted with a chaperone present.  Constitutional:      General: She is not in acute distress. Pulmonary:     Effort: Pulmonary effort is normal.  Abdominal:      General: There is no distension.     Palpations: Abdomen is soft.     Tenderness: There is no abdominal tenderness. There is no rebound.  Musculoskeletal:        General: No swelling. Normal range of motion.  Skin:    General: Skin is warm and dry.     Findings: No rash.  Neurological:     Mental Status: She is alert and oriented to person, place, and time.  Psychiatric:        Mood and Affect: Mood normal.        Behavior: Behavior normal.      GU / Detailed Urogynecologic Evaluation:  Pelvic Exam: Normal external female  genitalia; Bartholin's and Skene's glands normal in appearance; urethral meatus normal in appearance, no urethral masses or discharge.   CST: negative  s/p hysterectomy: Speculum exam reveals normal vaginal mucosa with  atrophy and normal vaginal cuff.  Adnexa normal adnexa.    Pelvic floor strength II/V, puborectalis IV/V external anal sphincter IV/V  Pelvic floor musculature: Right levator non-tender, Right obturator non-tender, Left levator non-tender, Left obturator non-tender  POP-Q:   POP-Q  -3                                            Aa   -3                                           Ba  -4.5                                              C   4                                            Gh  4                                            Pb  7                                            tvl   0                                            Ap  0                                            Bp                                                 D      Rectal Exam:  Normal sphincter tone, small distal rectocele, enterocoele present, no rectal masses, no sign of dyssynergia when asking the patient to bear down.  Post-Void Residual (PVR) by Bladder Scan: In order to evaluate bladder emptying, we discussed obtaining a postvoid residual and patient agreed to this procedure.  Procedure: The ultrasound unit was placed on the patient's abdomen in the  suprapubic region after the patient had voided.    Post Void Residual - 05/19/23 0910       Post Void Residual   Post Void Residual 31 mL  Laboratory Results: Lab Results  Component Value Date   COLORU Yellow 05/19/2023   CLARITYU Clear 05/19/2023   GLUCOSEUR Negative 05/19/2023   BILIRUBINUR Negative 05/19/2023   KETONESU Negative 05/19/2023   SPECGRAV 1.015 05/19/2023   RBCUR Negative 05/19/2023   PHUR 7.0 05/19/2023   PROTEINUR Negative 05/19/2023   UROBILINOGEN 0.2 05/19/2023   LEUKOCYTESUR Negative 05/19/2023    Lab Results  Component Value Date   CREATININE 0.79 07/31/2022    No results found for: "HGBA1C"  Lab Results  Component Value Date   HGB 14.3 07/31/2022     ASSESSMENT AND PLAN Ms. Trathen is a 58 y.o. with:  1. Vaginal vault prolapse after hysterectomy   2. Incontinence of feces, unspecified fecal incontinence type   3. Pelvic pain   4. Urinary urgency     Vaginal vault prolapse after hysterectomy Assessment & Plan: Stage 0 anterior, Stage II posterior, Stage I apical prolapse - For treatment of pelvic organ prolapse, we discussed options for management including expectant management, conservative management, and surgical management, such as Kegels, a pessary, pelvic floor physical therapy, and specific surgical procedures. - She is interested in surgery. Recommended posterior repair with sacrospinous fixation due to enterocele/ apical descent. We also discussed the possibility of sacrocolpopexy with mesh.  - Plan for surgery: Exam under anesthesia, posterior repair, perineorrhaphy with sacrospinous ligament fixation  - We reviewed the patient's specific anatomic and functional findings, with the assistance of diagrams, and together finalized the above procedure. The planned surgical procedures were discussed along with the surgical risks outlined below, which were also provided on a detailed handout. Additional treatment options  including expectant management, conservative management, medical management were discussed where appropriate.  We reviewed the benefits and risks of each treatment option.   General Surgical Risks: For all procedures, there are risks of bleeding, infection, damage to surrounding organs including but not limited to bowel, bladder, blood vessels, ureters and nerves, and need for further surgery if an injury were to occur. These risks are all low with minimally invasive surgery.   There are risks of numbness and weakness at any body site or buttock/rectal pain.  It is possible that baseline pain can be worsened by surgery, either with or without mesh. If surgery is vaginal, there is also a low risk of possible conversion to laparoscopy or open abdominal incision where indicated. Very rare risks include blood transfusion, blood clot, heart attack, pneumonia, or death.   There is also a risk of short-term postoperative urinary retention with need to use a catheter. About half of patients need to go home from surgery with a catheter, which is then later removed in the office. The risk of long-term need for a catheter is very low.  Prolapse (with or without mesh): Risk factors for surgical failure  include things that put pressure on your pelvis and the surgical repair, including obesity, chronic cough, and heavy lifting or straining (including lifting children or adults, straining on the toilet, or lifting heavy objects such as furniture or anything weighing >25 lbs. Risks of recurrence is 20-30% with vaginal native tissue repair and a less than 10% with sacrocolpopexy with mesh.   - For preop Visit:  She is required to have a visit within 30 days of her surgery.   - Medical clearance: not required  - Anticoagulant use: No - Medicaid Hysterectomy form: No - Accepts blood transfusion: Yes - Expected length of stay: outpatient  Request sent for surgery scheduling.  Incontinence of feces, unspecified  fecal incontinence type Assessment & Plan: - Treatment options include anti-diarrhea medication (loperamide/ Imodium OTC or prescription lomotil), fiber supplements, physical therapy, and possible sacral neuromodulation or surgery.   - recommended daily metamucil for stool bulking    Pelvic pain Assessment & Plan: - Will assess adnexa with pelvic US   Orders: -     US PELVIC COMPLETE WITH TRANSVAGINAL; Future  Urinary urgency Assessment & Plan: - She does not feel it is bothersome enough for treatment at this time. Discussed reducing bladder irritants such as soda.   Orders: -     POCT urinalysis dipstick     Marguerita Beards, MD

## 2023-05-19 NOTE — Patient Instructions (Addendum)
Plan for surgery: posterior repair with sacrospinous fixation  Accidental Bowel Leakage: Our goal is to achieve formed bowel movements daily or every-other-day without leakage.  You may need to try different combinations of the following options to find what works best for you.  Some management options include: Dietary changes (more leafy greens, vegetables and fruits; less processed foods) Fiber supplementation (Metamucil or something with psyllium as active ingredient) Over-the-counter imodium (tablets or liquid) to help solidify the stool and prevent leakage of stool.

## 2023-05-19 NOTE — Assessment & Plan Note (Signed)
-   Will assess adnexa with pelvic US

## 2023-05-19 NOTE — Assessment & Plan Note (Signed)
-   Treatment options include anti-diarrhea medication (loperamide/ Imodium OTC or prescription lomotil), fiber supplements, physical therapy, and possible sacral neuromodulation or surgery.   - recommended daily metamucil for stool bulking

## 2023-05-19 NOTE — Assessment & Plan Note (Signed)
-   She does not feel it is bothersome enough for treatment at this time. Discussed reducing bladder irritants such as soda.

## 2023-05-19 NOTE — Assessment & Plan Note (Addendum)
Stage 0 anterior, Stage II posterior, Stage I apical prolapse - For treatment of pelvic organ prolapse, we discussed options for management including expectant management, conservative management, and surgical management, such as Kegels, a pessary, pelvic floor physical therapy, and specific surgical procedures. - She is interested in surgery. Recommended posterior repair with sacrospinous fixation due to enterocele/ apical descent. We also discussed the possibility of sacrocolpopexy with mesh.  - Plan for surgery: Exam under anesthesia, posterior repair, perineorrhaphy with sacrospinous ligament fixation  - We reviewed the patient's specific anatomic and functional findings, with the assistance of diagrams, and together finalized the above procedure. The planned surgical procedures were discussed along with the surgical risks outlined below, which were also provided on a detailed handout. Additional treatment options including expectant management, conservative management, medical management were discussed where appropriate.  We reviewed the benefits and risks of each treatment option.   General Surgical Risks: For all procedures, there are risks of bleeding, infection, damage to surrounding organs including but not limited to bowel, bladder, blood vessels, ureters and nerves, and need for further surgery if an injury were to occur. These risks are all low with minimally invasive surgery.   There are risks of numbness and weakness at any body site or buttock/rectal pain.  It is possible that baseline pain can be worsened by surgery, either with or without mesh. If surgery is vaginal, there is also a low risk of possible conversion to laparoscopy or open abdominal incision where indicated. Very rare risks include blood transfusion, blood clot, heart attack, pneumonia, or death.   There is also a risk of short-term postoperative urinary retention with need to use a catheter. About half of patients need to  go home from surgery with a catheter, which is then later removed in the office. The risk of long-term need for a catheter is very low.  Prolapse (with or without mesh): Risk factors for surgical failure  include things that put pressure on your pelvis and the surgical repair, including obesity, chronic cough, and heavy lifting or straining (including lifting children or adults, straining on the toilet, or lifting heavy objects such as furniture or anything weighing >25 lbs. Risks of recurrence is 20-30% with vaginal native tissue repair and a less than 10% with sacrocolpopexy with mesh.   - For preop Visit:  She is required to have a visit within 30 days of her surgery.   - Medical clearance: not required  - Anticoagulant use: No - Medicaid Hysterectomy form: No - Accepts blood transfusion: Yes - Expected length of stay: outpatient  Request sent for surgery scheduling.

## 2023-05-24 ENCOUNTER — Ambulatory Visit (HOSPITAL_BASED_OUTPATIENT_CLINIC_OR_DEPARTMENT_OTHER)
Admission: RE | Admit: 2023-05-24 | Discharge: 2023-05-24 | Disposition: A | Discharge status: Home / Self Care | Payer: Commercial Managed Care - PPO | Source: Ambulatory Visit | Attending: Obstetrics and Gynecology | Admitting: Obstetrics and Gynecology

## 2023-05-24 DIAGNOSIS — R102 Pelvic and perineal pain unspecified side: Secondary | ICD-10-CM

## 2023-06-24 ENCOUNTER — Other Ambulatory Visit (HOSPITAL_BASED_OUTPATIENT_CLINIC_OR_DEPARTMENT_OTHER): Payer: Self-pay

## 2023-06-24 MED ORDER — ESTRADIOL 10 MCG VA TABS
1.0000 | ORAL_TABLET | VAGINAL | 0 refills | Status: DC
Start: 2023-06-27 — End: 2023-08-10
  Filled 2023-06-24: qty 8, 28d supply, fill #0

## 2023-06-24 MED ORDER — EZETIMIBE 10 MG PO TABS
10.0000 mg | ORAL_TABLET | Freq: Every day | ORAL | 3 refills | Status: AC
  Filled 2023-06-24: qty 90, 90d supply, fill #0
  Filled 2023-10-27: qty 90, 90d supply, fill #1
  Filled 2024-04-11: qty 90, 90d supply, fill #2

## 2023-06-27 ENCOUNTER — Other Ambulatory Visit (HOSPITAL_BASED_OUTPATIENT_CLINIC_OR_DEPARTMENT_OTHER): Payer: Self-pay

## 2023-06-28 NOTE — Progress Notes (Signed)
 [name] is a 58 y.o. female called and notified of that FMLA paper were received by the UROGYN dept from [employer] notified there will be a 10-14 day turn around for forms to be ready and  she will be contacted as soon as the from are ready and have been faxed.  Pre-op date: 07/15/2023 Surgery date: 08/01/2023 Post op date:09/13/2023

## 2023-06-29 ENCOUNTER — Other Ambulatory Visit (HOSPITAL_BASED_OUTPATIENT_CLINIC_OR_DEPARTMENT_OTHER): Payer: Self-pay | Admitting: Family Medicine

## 2023-06-29 DIAGNOSIS — Z1231 Encounter for screening mammogram for malignant neoplasm of breast: Secondary | ICD-10-CM

## 2023-06-30 ENCOUNTER — Ambulatory Visit (HOSPITAL_BASED_OUTPATIENT_CLINIC_OR_DEPARTMENT_OTHER)
Admission: RE | Admit: 2023-06-30 | Discharge: 2023-06-30 | Disposition: A | Discharge status: Home / Self Care | Source: Ambulatory Visit | Attending: Family Medicine | Admitting: Family Medicine

## 2023-06-30 ENCOUNTER — Encounter (HOSPITAL_BASED_OUTPATIENT_CLINIC_OR_DEPARTMENT_OTHER): Payer: Self-pay | Admitting: Radiology

## 2023-06-30 DIAGNOSIS — Z1231 Encounter for screening mammogram for malignant neoplasm of breast: Secondary | ICD-10-CM | POA: Insufficient documentation

## 2023-07-04 DIAGNOSIS — L814 Other melanin hyperpigmentation: Secondary | ICD-10-CM | POA: Diagnosis not present

## 2023-07-04 DIAGNOSIS — D225 Melanocytic nevi of trunk: Secondary | ICD-10-CM | POA: Diagnosis not present

## 2023-07-04 DIAGNOSIS — L821 Other seborrheic keratosis: Secondary | ICD-10-CM | POA: Diagnosis not present

## 2023-07-04 DIAGNOSIS — L738 Other specified follicular disorders: Secondary | ICD-10-CM | POA: Diagnosis not present

## 2023-07-04 DIAGNOSIS — D1801 Hemangioma of skin and subcutaneous tissue: Secondary | ICD-10-CM | POA: Diagnosis not present

## 2023-07-12 ENCOUNTER — Other Ambulatory Visit (HOSPITAL_BASED_OUTPATIENT_CLINIC_OR_DEPARTMENT_OTHER): Payer: Self-pay

## 2023-07-13 ENCOUNTER — Encounter: Payer: Commercial Managed Care - PPO | Admitting: Obstetrics and Gynecology

## 2023-07-15 ENCOUNTER — Other Ambulatory Visit (HOSPITAL_COMMUNITY): Payer: Self-pay

## 2023-07-15 ENCOUNTER — Ambulatory Visit: Payer: Commercial Managed Care - PPO | Admitting: Obstetrics and Gynecology

## 2023-07-15 ENCOUNTER — Other Ambulatory Visit: Payer: Self-pay | Admitting: Obstetrics and Gynecology

## 2023-07-15 ENCOUNTER — Encounter: Payer: Self-pay | Admitting: Obstetrics and Gynecology

## 2023-07-15 VITALS — BP 117/81 | HR 73

## 2023-07-15 DIAGNOSIS — N993 Prolapse of vaginal vault after hysterectomy: Secondary | ICD-10-CM

## 2023-07-15 DIAGNOSIS — Z01818 Encounter for other preprocedural examination: Secondary | ICD-10-CM

## 2023-07-15 MED ORDER — ONDANSETRON HCL 4 MG PO TABS
4.0000 mg | ORAL_TABLET | Freq: Three times a day (TID) | ORAL | 0 refills | Status: AC | PRN
  Filled 2023-07-15 – 2023-07-18 (×3): qty 10, 4d supply, fill #0

## 2023-07-15 MED ORDER — TRAMADOL HCL 50 MG PO TABS
50.0000 mg | ORAL_TABLET | Freq: Three times a day (TID) | ORAL | 0 refills | Status: AC | PRN
  Filled 2023-07-15 – 2023-07-18 (×2): qty 15, 5d supply, fill #0

## 2023-07-15 MED ORDER — IBUPROFEN 600 MG PO TABS
600.0000 mg | ORAL_TABLET | Freq: Four times a day (QID) | ORAL | 0 refills | Status: AC | PRN
  Filled 2023-07-15 – 2023-07-18 (×3): qty 30, 8d supply, fill #0

## 2023-07-15 MED ORDER — POLYETHYLENE GLYCOL 3350 17 GM/SCOOP PO POWD
17.0000 g | Freq: Every day | ORAL | 0 refills | Status: AC
  Filled 2023-07-15 – 2023-07-18 (×3): qty 238, 14d supply, fill #0

## 2023-07-15 MED ORDER — ESTRADIOL 0.1 MG/GM VA CREA
0.5000 g | TOPICAL_CREAM | VAGINAL | 11 refills | Status: AC
  Filled 2023-07-15: qty 42.5, 90d supply, fill #0
  Filled 2023-07-18 (×2): qty 42.5, 30d supply, fill #0
  Filled 2023-09-20: qty 42.5, 30d supply, fill #1
  Filled 2023-11-24: qty 42.5, 30d supply, fill #2

## 2023-07-15 MED ORDER — ACETAMINOPHEN 500 MG PO TABS
500.0000 mg | ORAL_TABLET | Freq: Four times a day (QID) | ORAL | 0 refills | Status: AC | PRN
  Filled 2023-07-15 – 2023-07-18 (×3): qty 30, 8d supply, fill #0

## 2023-07-15 NOTE — H&P (Addendum)
 [facility] H&P  Subjective Chief Complaint: [name] presents for a preoperative encounter.   History of Present Illness: [name] is a 58 y.o. female who presents for preoperative visit.  She is scheduled to undergo  Exam under anesthesia, posterior repair, perineorrhaphy with sacrospinous ligament fixation  on 08/01/23.  Her symptoms include Pelvic organ prolapse, and she was was found to have Stage 0 anterior, Stage II posterior, Stage I apical prolapse.   Urodynamics showed: Deferred   Past Medical History:  Diagnosis Date   Arthritis    GERD (gastroesophageal reflux disease)    Gestational diabetes    Headache    Hyperlipidemia    PONV (postoperative nausea and vomiting)    Seasonal allergies    Seizure (HCC)    after taking sudafed - never proven     Past Surgical History:  Procedure Laterality Date   AUGMENTATION MAMMAPLASTY     BLEPHAROPLASTY Bilateral    BREAST ENHANCEMENT SURGERY  1991   breast implants    COLONOSCOPY     COLONOSCOPY WITH PROPOFOL N/A 01/27/2016   Procedure: COLONOSCOPY WITH PROPOFOL;  Surgeon: Vida Rigger, MD;  Location: [facility] ENDOSCOPY;  Service: Endoscopy;  Laterality: N/A;   ESOPHAGOGASTRODUODENOSCOPY (EGD) WITH PROPOFOL N/A 01/27/2016   Procedure: ESOPHAGOGASTRODUODENOSCOPY (EGD) WITH PROPOFOL;  Surgeon: Vida Rigger, MD;  Location: [facility] ENDOSCOPY;  Service: Endoscopy;  Laterality: N/A;   LIPOSUCTION     reverse tummy tuck     VAGINAL HYSTERECTOMY  11/07/2002   Total vaginal hysterectomy, anterior and posterior colporrhaphy,  TVT procedure, cystoscopy.    is allergic to codeine.   Family History  Problem Relation Age of Onset   Diabetes Mellitus II Mother    Sleep apnea Mother    Hypertension Mother    High Cholesterol Mother    Stroke Mother    Hypertension Father    High Cholesterol Father    Alcoholism Father    Cancer Maternal Grandmother        Biliary Cancer   Colon polyps Maternal Grandfather     Diabetes Maternal Grandfather    Alcoholism Son    Breast cancer Maternal Aunt    Hypertension Other    Hyperlipidemia Other     Social History   Tobacco Use   Smoking status: Never   Smokeless tobacco: Never  Vaping Use   Vaping status: Never Used  Substance Use Topics   Alcohol use: No   Drug use: No     Review of Systems was negative for a full 10 system review except as noted in the History of Present Illness.  No current facility-administered medications for this encounter.  Current Outpatient Medications:    acetaminophen (TYLENOL) 500 MG tablet, Take 1 tablet (500 mg total) by mouth every 6 (six) hours as needed (pain)., Disp: 30 tablet, Rfl: 0   Armodafinil 150 MG tablet, Take 1 tablet (150 mg total) by mouth daily., Disp: 30 tablet, Rfl: 3   calcium-vitamin D (OSCAL WITH D) 500-200 MG-UNIT per tablet, Take 1 tablet by mouth as needed., Disp: , Rfl:    esomeprazole (NEXIUM) 40 MG capsule, Take 1 capsule (40 mg total) by mouth daily before breakfast., Disp: 90 capsule, Rfl: 3   [START ON 07/18/2023] estradiol (ESTRACE) 0.1 MG/GM vaginal cream, Place 0.5 g vaginally 2 (two) times a week. Use starting day 7 postop at the perineal area., Disp: 42.5 g, Rfl: 11   Estradiol 10 MCG TABS vaginal tablet, Place 1 tablet (10 mcg  total) vaginally 2 (two) times a week., Disp: 24 tablet, Rfl: 3   Estradiol 10 MCG TABS vaginal tablet, Place 1 tablet (10 mcg total) vaginally 2 (two) times a week., Disp: 14 tablet, Rfl: 0   ezetimibe (ZETIA) 10 MG tablet, Take 1 tablet (10 mg total) by mouth daily., Disp: 90 tablet, Rfl: 3   famotidine (PEPCID) 20 MG tablet, , Disp: , Rfl:    fish oil-omega-3 fatty acids 1000 MG capsule, Take 100 mg by mouth 2 (two) times daily., Disp: , Rfl:    fluticasone (FLONASE) 50 MCG/ACT nasal spray, , Disp: , Rfl:    ibuprofen (ADVIL) 600 MG tablet, Take 1 tablet (600 mg total) by mouth every 6 (six) hours as needed., Disp: 30 tablet, Rfl: 0   Lifitegrast (XIIDRA) 5  % SOLN, Instill 1 drop into both eyes twice a day, Disp: 180 each, Rfl: 3   loratadine (CLARITIN) 10 MG tablet, , Disp: , Rfl:    meloxicam (MOBIC) 15 MG tablet, Take 1 tablet (15 mg total) by mouth daily., Disp: 90 tablet, Rfl: 0   methocarbamol (ROBAXIN) 500 MG tablet, Take 500 mg by mouth as needed., Disp: , Rfl:    ondansetron (ZOFRAN) 4 MG tablet, Take 1 tablet (4 mg total) by mouth every 8 (eight) hours as needed for nausea or vomiting., Disp: 10 tablet, Rfl: 0   polyethylene glycol powder (GLYCOLAX/MIRALAX) 17 GM/SCOOP powder, Take 17 g by mouth daily. Drink 17g (1 scoop) dissolved in water per day., Disp: 255 g, Rfl: 0   pravastatin (PRAVACHOL) 80 MG tablet, Take 1 tablet (80 mg total) by mouth  nightly., Disp: 90 tablet, Rfl: 3   traMADol (ULTRAM) 50 MG tablet, Take 1 tablet (50 mg total) by mouth every 8 (eight) hours as needed for up to 5 days., Disp: 15 tablet, Rfl: 0   Objective There were no vitals filed for this visit.   Gen: NAD CV: S1 S2 RRR Lungs: Clear to auscultation bilaterally Abd: soft, nontender   Previous Pelvic Exam showed: POP-Q   -3                                            Aa   -3                                           Ba   -4.5                                              C    4                                            Gh   4                                            Pb   7  tvl    0                                            Ap   0                                            Bp                                           Assessment/ Plan  Assessment: The patient is a 58 y.o. year old scheduled to undergo Exam under anesthesia, posterior repair, perineorrhaphy with sacrospinous ligament fixation. Verbal consent was obtained for these procedures.

## 2023-07-15 NOTE — Progress Notes (Signed)
  Urogynecology Pre-Operative Exam  Subjective Chief Complaint: [name] presents for a preoperative encounter.   History of Present Illness: [name] is a 58 y.o. female who presents for preoperative visit.  She is scheduled to undergo  Exam under anesthesia, posterior repair, perineorrhaphy with sacrospinous ligament fixation, and cystoscopy  on 08/01/23.  Her symptoms include Pelvic organ prolapse, and she was was found to have Stage 0 anterior, Stage II posterior, Stage I apical prolapse.   Urodynamics showed: Deferred   Past Medical History:  Diagnosis Date   Arthritis    GERD (gastroesophageal reflux disease)    Gestational diabetes    Headache    Hyperlipidemia    PONV (postoperative nausea and vomiting)    Seasonal allergies    Seizure (HCC)    after taking sudafed - never proven     Past Surgical History:  Procedure Laterality Date   AUGMENTATION MAMMAPLASTY     BLEPHAROPLASTY Bilateral    BREAST ENHANCEMENT SURGERY  1991   breast implants    COLONOSCOPY     COLONOSCOPY WITH PROPOFOL N/A 01/27/2016   Procedure: COLONOSCOPY WITH PROPOFOL;  Surgeon: Vida Rigger, MD;  Location: [facility] ENDOSCOPY;  Service: Endoscopy;  Laterality: N/A;   ESOPHAGOGASTRODUODENOSCOPY (EGD) WITH PROPOFOL N/A 01/27/2016   Procedure: ESOPHAGOGASTRODUODENOSCOPY (EGD) WITH PROPOFOL;  Surgeon: Vida Rigger, MD;  Location: [facility] ENDOSCOPY;  Service: Endoscopy;  Laterality: N/A;   LIPOSUCTION     reverse tummy tuck     VAGINAL HYSTERECTOMY  11/07/2002   Total vaginal hysterectomy, anterior and posterior colporrhaphy,  TVT procedure, cystoscopy.    is allergic to codeine.   Family History  Problem Relation Age of Onset   Diabetes Mellitus II Mother    Sleep apnea Mother    Hypertension Mother    High Cholesterol Mother    Stroke Mother    Hypertension Father    High Cholesterol Father    Alcoholism Father    Cancer Maternal Grandmother        Biliary Cancer   Colon polyps  Maternal Grandfather    Diabetes Maternal Grandfather    Alcoholism Son    Breast cancer Maternal Aunt    Hypertension Other    Hyperlipidemia Other     Social History   Tobacco Use   Smoking status: Never   Smokeless tobacco: Never  Vaping Use   Vaping status: Never Used  Substance Use Topics   Alcohol use: No   Drug use: No     Review of Systems was negative for a full 10 system review except as noted in the History of Present Illness.   Current Outpatient Medications:    Armodafinil 150 MG tablet, Take 1 tablet (150 mg total) by mouth daily., Disp: 30 tablet, Rfl: 3   calcium-vitamin D (OSCAL WITH D) 500-200 MG-UNIT per tablet, Take 1 tablet by mouth as needed., Disp: , Rfl:    esomeprazole (NEXIUM) 40 MG capsule, Take 1 capsule (40 mg total) by mouth daily before breakfast., Disp: 90 capsule, Rfl: 3   Estradiol 10 MCG TABS vaginal tablet, Place 1 tablet (10 mcg total) vaginally 2 (two) times a week., Disp: 24 tablet, Rfl: 3   Estradiol 10 MCG TABS vaginal tablet, Place 1 tablet (10 mcg total) vaginally 2 (two) times a week., Disp: 14 tablet, Rfl: 0   ezetimibe (ZETIA) 10 MG tablet, Take 1 tablet (10 mg total) by mouth daily., Disp: 90 tablet, Rfl: 3   famotidine (PEPCID) 20 MG tablet, ,  Disp: , Rfl:    fish oil-omega-3 fatty acids 1000 MG capsule, Take 100 mg by mouth 2 (two) times daily., Disp: , Rfl:    fluticasone (FLONASE) 50 MCG/ACT nasal spray, , Disp: , Rfl:    Lifitegrast (XIIDRA) 5 % SOLN, Instill 1 drop into both eyes twice a day, Disp: 180 each, Rfl: 3   loratadine (CLARITIN) 10 MG tablet, , Disp: , Rfl:    meloxicam (MOBIC) 15 MG tablet, Take 1 tablet (15 mg total) by mouth daily., Disp: 90 tablet, Rfl: 0   methocarbamol (ROBAXIN) 500 MG tablet, Take 500 mg by mouth as needed., Disp: , Rfl:    pravastatin (PRAVACHOL) 80 MG tablet, Take 1 tablet (80 mg total) by mouth  nightly., Disp: 90 tablet, Rfl: 3   Objective Vitals:   07/15/23 0804  BP: 117/81  Pulse:  73    Gen: NAD CV: S1 S2 RRR Lungs: Clear to auscultation bilaterally Abd: soft, nontender   Previous Pelvic Exam showed: POP-Q   -3                                            Aa   -3                                           Ba   -4.5                                              C    4                                            Gh   4                                            Pb   7                                            tvl    0                                            Ap   0                                            Bp                                           Assessment/ Plan  Assessment: The patient is  a 58 y.o. year old scheduled to undergo Exam under anesthesia, posterior repair, perineorrhaphy with sacrospinous ligament fixation, and cystoscopy. Verbal consent was obtained for these procedures.  Plan: General Surgical Consent: The patient has previously been counseled on alternative treatments, and the decision by the patient and provider was to proceed with the procedure listed above.  For all procedures, there are risks of bleeding, infection, damage to surrounding organs including but not limited to bowel, bladder, blood vessels, ureters and nerves, and need for further surgery if an injury were to occur. These risks are all low with minimally invasive surgery.   There are risks of numbness and weakness at any body site or buttock/rectal pain.  It is possible that baseline pain can be worsened by surgery, either with or without mesh. If surgery is vaginal, there is also a low risk of possible conversion to laparoscopy or open abdominal incision where indicated. Very rare risks include blood transfusion, blood clot, heart attack, pneumonia, or death.   There is also a risk of short-term postoperative urinary retention with need to use a catheter. About half of patients need to go home from surgery with a catheter, which is then later removed in the  office. The risk of long-term need for a catheter is very low. There is also a risk of worsening of overactive bladder.     Prolapse (with or without mesh): Risk factors for surgical failure  include things that put pressure on your pelvis and the surgical repair, including obesity, chronic cough, and heavy lifting or straining (including lifting children or adults, straining on the toilet, or lifting heavy objects such as furniture or anything weighing >25 lbs. Risks of recurrence is 20-30% with vaginal native tissue repair and a less than 10% with sacrocolpopexy with mesh.     We discussed consent for blood products. Risks for blood transfusion include allergic reactions, other reactions that can affect different body organs and managed accordingly, transmission of infectious diseases such as HIV or Hepatitis. However, the blood is screened. Patient consents for blood products.  Pre-operative instructions:  She was instructed to not take Aspirin/NSAIDs x 7days prior to surgery. Antibiotic prophylaxis was ordered as indicated. Encouraged her to hold her fish oil x7 days.   Catheter use: Patient will go home with foley if needed after post-operative voiding trial.  Post-operative instructions:  She was provided with specific post-operative instructions, including precautions and signs/symptoms for which we would recommend contacting us, in addition to daytime and after-hours contact phone numbers. This was provided on a handout.   Post-operative medications: Prescriptions for motrin, tylenol, miralax, Zofran, Estrogen cream and Tramadol were sent to her pharmacy. Discussed using ibuprofen and tylenol on a schedule to limit use of narcotics.   Laboratory testing:  We will check labs: as requested by anesthesia  Preoperative clearance:  She does not require surgical clearance.    Post-operative follow-up:  A post-operative appointment will be made for 6 weeks from the date of surgery. If she needs  a post-operative nurse visit for a voiding trial, that will be set up after she leaves the hospital.    Patient will call the clinic or use MyChart should anything change or any new issues arise.   Selmer Dominion, NP

## 2023-07-18 ENCOUNTER — Other Ambulatory Visit (HOSPITAL_BASED_OUTPATIENT_CLINIC_OR_DEPARTMENT_OTHER): Payer: Self-pay

## 2023-07-18 ENCOUNTER — Other Ambulatory Visit (HOSPITAL_COMMUNITY): Payer: Self-pay

## 2023-07-19 NOTE — Progress Notes (Signed)
 Forms have been completed and faxed See scan

## 2023-07-21 ENCOUNTER — Encounter (HOSPITAL_COMMUNITY): Payer: Self-pay | Admitting: Obstetrics and Gynecology

## 2023-07-21 NOTE — Progress Notes (Signed)
 Spoke w/ via phone for pre-op interview--- [facility] Lab needs dos----  NONE       Lab results------ COVID test -----patient states asymptomatic no test needed Arrive at -------1130 NPO after MN NO Solid Food.  Clear liquids from MN until---1030 Pre-Surgery Ensure or G2:  Med rec completed Medications to take morning of surgery -----Nexium Diabetic medication -----  GLP1 agonist last dose: GLP1 instructions:  Patient instructed no nail polish to be worn day of surgery Patient instructed to bring photo id and insurance card day of surgery Patient aware to have Driver (ride ) / caregiver    for 24 hours after surgery - Sig other Dennis Bast Patient Special Instructions ----- Shower with antibacterial soap. Pre-Op special Instructions -----  Patient verbalized understanding of instructions that were given at this phone interview. Patient denies chest pain, sob, fever, cough at the interview.

## 2023-07-29 NOTE — Progress Notes (Signed)
 Called and talked to patient about time change to her surgery time for Monday. Be here at 0900 clear liquids until 0800.

## 2023-08-01 ENCOUNTER — Other Ambulatory Visit: Payer: Self-pay

## 2023-08-01 ENCOUNTER — Encounter (HOSPITAL_COMMUNITY)
Admission: RE | Disposition: A | Discharge status: Home / Self Care | Payer: Self-pay | Source: Home / Self Care | Attending: Obstetrics and Gynecology

## 2023-08-01 ENCOUNTER — Telehealth: Payer: Self-pay | Admitting: Obstetrics and Gynecology

## 2023-08-01 ENCOUNTER — Ambulatory Visit (HOSPITAL_COMMUNITY)
Admission: RE | Admit: 2023-08-01 | Discharge: 2023-08-01 | Disposition: A | Discharge status: Home / Self Care | Payer: Commercial Managed Care - PPO | Attending: Obstetrics and Gynecology | Admitting: Obstetrics and Gynecology

## 2023-08-01 ENCOUNTER — Ambulatory Visit (HOSPITAL_BASED_OUTPATIENT_CLINIC_OR_DEPARTMENT_OTHER): Payer: Self-pay | Admitting: Anesthesiology

## 2023-08-01 ENCOUNTER — Ambulatory Visit (HOSPITAL_COMMUNITY): Payer: Self-pay | Admitting: Anesthesiology

## 2023-08-01 ENCOUNTER — Encounter (HOSPITAL_COMMUNITY): Payer: Self-pay | Admitting: Obstetrics and Gynecology

## 2023-08-01 DIAGNOSIS — N993 Prolapse of vaginal vault after hysterectomy: Secondary | ICD-10-CM | POA: Diagnosis not present

## 2023-08-01 DIAGNOSIS — G40909 Epilepsy, unspecified, not intractable, without status epilepticus: Secondary | ICD-10-CM | POA: Diagnosis not present

## 2023-08-01 DIAGNOSIS — N8110 Cystocele, unspecified: Secondary | ICD-10-CM

## 2023-08-01 DIAGNOSIS — Z01818 Encounter for other preprocedural examination: Secondary | ICD-10-CM

## 2023-08-01 DIAGNOSIS — N816 Rectocele: Secondary | ICD-10-CM | POA: Diagnosis not present

## 2023-08-01 HISTORY — PX: PERINEOPLASTY: SHX2218

## 2023-08-01 HISTORY — PX: EXAM UNDER ANESTHESIA, PELVIC: SHX7461

## 2023-08-01 HISTORY — PX: ANTERIOR AND POSTERIOR REPAIR WITH SACROSPINOUS FIXATION: SHX6536

## 2023-08-01 SURGERY — ANTERIOR AND POSTERIOR REPAIR WITH SACROSPINOUS FIXATION
Anesthesia: General | Site: Vagina

## 2023-08-01 MED ORDER — PROPOFOL 10 MG/ML IV BOLUS
INTRAVENOUS | Status: AC
Start: 2023-08-01 — End: ?
  Filled 2023-08-01: qty 20

## 2023-08-01 MED ORDER — PROPOFOL 10 MG/ML IV BOLUS
INTRAVENOUS | Status: AC
  Filled 2023-08-01: qty 20

## 2023-08-01 MED ORDER — MIDAZOLAM HCL 2 MG/2ML IJ SOLN
INTRAMUSCULAR | Status: DC | PRN
  Administered 2023-08-01: 2 mg via INTRAVENOUS

## 2023-08-01 MED ORDER — PHENYLEPHRINE 80 MCG/ML (10ML) SYRINGE FOR IV PUSH (FOR BLOOD PRESSURE SUPPORT)
PREFILLED_SYRINGE | INTRAVENOUS | Status: DC | PRN
  Administered 2023-08-01: 40 ug via INTRAVENOUS
  Administered 2023-08-01: 80 ug via INTRAVENOUS
  Administered 2023-08-01: 40 ug via INTRAVENOUS

## 2023-08-01 MED ORDER — CHLORHEXIDINE GLUCONATE 0.12 % MT SOLN: 15.0000 mL | Freq: Once | OROMUCOSAL | Status: DC

## 2023-08-01 MED ORDER — DEXAMETHASONE SODIUM PHOSPHATE 10 MG/ML IJ SOLN
INTRAMUSCULAR | Status: AC
  Filled 2023-08-01: qty 1

## 2023-08-01 MED ORDER — FENTANYL CITRATE (PF) 250 MCG/5ML IJ SOLN
INTRAMUSCULAR | Status: DC | PRN
  Administered 2023-08-01 (×2): 25 ug via INTRAVENOUS
  Administered 2023-08-01: 50 ug via INTRAVENOUS

## 2023-08-01 MED ORDER — OXYCODONE HCL 5 MG PO TABS
5.0000 mg | ORAL_TABLET | Freq: Once | ORAL | Status: AC | PRN
  Administered 2023-08-01: 5 mg via ORAL

## 2023-08-01 MED ORDER — DEXMEDETOMIDINE HCL IN NACL 80 MCG/20ML IV SOLN
INTRAVENOUS | Status: DC | PRN
  Administered 2023-08-01: 8 ug via INTRAVENOUS
  Administered 2023-08-01: 4 ug via INTRAVENOUS

## 2023-08-01 MED ORDER — CHLORHEXIDINE GLUCONATE 0.12 % MT SOLN
OROMUCOSAL | Status: AC
  Administered 2023-08-01: 15 mL
  Filled 2023-08-01: qty 15

## 2023-08-01 MED ORDER — OXYCODONE HCL 5 MG PO TABS
ORAL_TABLET | ORAL | Status: AC
  Filled 2023-08-01: qty 1

## 2023-08-01 MED ORDER — DEXAMETHASONE SODIUM PHOSPHATE 10 MG/ML IJ SOLN
INTRAMUSCULAR | Status: DC | PRN
  Administered 2023-08-01: 10 mg via INTRAVENOUS

## 2023-08-01 MED ORDER — PROPOFOL 10 MG/ML IV BOLUS
INTRAVENOUS | Status: DC | PRN
Start: 2023-08-01 — End: 2023-08-01
  Administered 2023-08-01: 200 mg via INTRAVENOUS

## 2023-08-01 MED ORDER — CEFAZOLIN SODIUM-DEXTROSE 2-4 GM/100ML-% IV SOLN
2.0000 g | INTRAVENOUS | Status: AC
  Administered 2023-08-01: 2 g via INTRAVENOUS

## 2023-08-01 MED ORDER — FENTANYL CITRATE (PF) 100 MCG/2ML IJ SOLN
25.0000 ug | INTRAMUSCULAR | Status: DC | PRN
Start: 2023-08-01 — End: 2023-08-01

## 2023-08-01 MED ORDER — MIDAZOLAM HCL 2 MG/2ML IJ SOLN
INTRAMUSCULAR | Status: AC
  Filled 2023-08-01: qty 2

## 2023-08-01 MED ORDER — ONDANSETRON HCL 4 MG/2ML IJ SOLN
INTRAMUSCULAR | Status: AC
  Filled 2023-08-01: qty 2

## 2023-08-01 MED ORDER — ORAL CARE MOUTH RINSE
15.0000 mL | Freq: Once | OROMUCOSAL | Status: DC
Start: 2023-08-01 — End: 2023-08-01

## 2023-08-01 MED ORDER — ONDANSETRON HCL 4 MG/2ML IJ SOLN
INTRAMUSCULAR | Status: DC | PRN
  Administered 2023-08-01: 4 mg via INTRAVENOUS

## 2023-08-01 MED ORDER — LIDOCAINE-EPINEPHRINE 1 %-1:100000 IJ SOLN
INTRAMUSCULAR | Status: DC | PRN
  Administered 2023-08-01: 20 mL

## 2023-08-01 MED ORDER — LIDOCAINE 2% (20 MG/ML) 5 ML SYRINGE
INTRAMUSCULAR | Status: DC | PRN
Start: 2023-08-01 — End: 2023-08-01
  Administered 2023-08-01: 60 mg via INTRAVENOUS

## 2023-08-01 MED ORDER — SCOPOLAMINE 1 MG/3DAYS TD PT72
MEDICATED_PATCH | TRANSDERMAL | Status: AC
  Filled 2023-08-01: qty 1

## 2023-08-01 MED ORDER — OXYCODONE HCL 5 MG/5ML PO SOLN: 5.0000 mg | Freq: Once | ORAL | Status: AC | PRN

## 2023-08-01 MED ORDER — EPHEDRINE 5 MG/ML INJ
INTRAVENOUS | Status: AC
  Filled 2023-08-01: qty 5

## 2023-08-01 MED ORDER — GABAPENTIN 300 MG PO CAPS
ORAL_CAPSULE | ORAL | Status: AC
  Filled 2023-08-01: qty 1

## 2023-08-01 MED ORDER — GABAPENTIN 300 MG PO CAPS
300.0000 mg | ORAL_CAPSULE | ORAL | Status: AC
  Administered 2023-08-01: 300 mg via ORAL

## 2023-08-01 MED ORDER — ACETAMINOPHEN 500 MG PO TABS
ORAL_TABLET | ORAL | Status: AC
  Filled 2023-08-01: qty 2

## 2023-08-01 MED ORDER — AMISULPRIDE (ANTIEMETIC) 5 MG/2ML IV SOLN
10.0000 mg | Freq: Once | INTRAVENOUS | Status: AC | PRN
  Administered 2023-08-01: 10 mg via INTRAVENOUS

## 2023-08-01 MED ORDER — PROPOFOL 500 MG/50ML IV EMUL
INTRAVENOUS | Status: DC | PRN
  Administered 2023-08-01: 250 ug/kg/min via INTRAVENOUS

## 2023-08-01 MED ORDER — STERILE WATER FOR IRRIGATION IR SOLN
Status: DC | PRN
  Administered 2023-08-01: 1000 mL

## 2023-08-01 MED ORDER — LIDOCAINE 2% (20 MG/ML) 5 ML SYRINGE
INTRAMUSCULAR | Status: AC
  Filled 2023-08-01: qty 5

## 2023-08-01 MED ORDER — PHENYLEPHRINE 80 MCG/ML (10ML) SYRINGE FOR IV PUSH (FOR BLOOD PRESSURE SUPPORT)
PREFILLED_SYRINGE | INTRAVENOUS | Status: AC
  Filled 2023-08-01: qty 10

## 2023-08-01 MED ORDER — LIDOCAINE-EPINEPHRINE 1 %-1:100000 IJ SOLN
INTRAMUSCULAR | Status: AC
  Filled 2023-08-01: qty 1

## 2023-08-01 MED ORDER — CEFAZOLIN SODIUM-DEXTROSE 2-4 GM/100ML-% IV SOLN
INTRAVENOUS | Status: AC
  Filled 2023-08-01: qty 100

## 2023-08-01 MED ORDER — SCOPOLAMINE 1 MG/3DAYS TD PT72
1.0000 | MEDICATED_PATCH | TRANSDERMAL | Status: DC
  Administered 2023-08-01: 1.5 mg via TRANSDERMAL

## 2023-08-01 MED ORDER — ACETAMINOPHEN 500 MG PO TABS
1000.0000 mg | ORAL_TABLET | ORAL | Status: AC
  Administered 2023-08-01: 1000 mg via ORAL

## 2023-08-01 MED ORDER — AMISULPRIDE (ANTIEMETIC) 5 MG/2ML IV SOLN
INTRAVENOUS | Status: AC
  Filled 2023-08-01: qty 4

## 2023-08-01 MED ORDER — FENTANYL CITRATE (PF) 250 MCG/5ML IJ SOLN
INTRAMUSCULAR | Status: AC
  Filled 2023-08-01: qty 5

## 2023-08-01 MED ORDER — EPHEDRINE SULFATE-NACL 50-0.9 MG/10ML-% IV SOSY
PREFILLED_SYRINGE | INTRAVENOUS | Status: DC | PRN
Start: 2023-08-01 — End: 2023-08-01
  Administered 2023-08-01 (×2): 5 mg via INTRAVENOUS
  Administered 2023-08-01: 10 mg via INTRAVENOUS

## 2023-08-01 MED ORDER — LACTATED RINGERS IV SOLN: INTRAVENOUS | Status: DC

## 2023-08-01 SURGICAL SUPPLY — 30 items
BLADE SURG 15 STRL LF DISP TIS (BLADE) ×4 IMPLANT
DEVICE CAPIO SLIM SINGLE (INSTRUMENTS) ×4 IMPLANT
GLOVE BIOGEL PI IND STRL 6.5 (GLOVE) ×4 IMPLANT
GLOVE BIOGEL PI IND STRL 7.0 (GLOVE) ×4 IMPLANT
GLOVE BIOGEL PI IND STRL 7.5 (GLOVE) IMPLANT
GLOVE ECLIPSE 6.0 STRL STRAW (GLOVE) ×4 IMPLANT
GOWN STRL REUS W/ TWL LRG LVL3 (GOWN DISPOSABLE) ×4 IMPLANT
GOWN STRL SURGICAL XL XLNG (GOWN DISPOSABLE) IMPLANT
HIBICLENS CHG 4% 4OZ (MISCELLANEOUS) ×4 IMPLANT
HOLDER FOLEY CATH W/STRAP (MISCELLANEOUS) IMPLANT
KIT TURNOVER KIT B (KITS) ×4 IMPLANT
NDL HYPO 22X1.5 SAFETY MO (MISCELLANEOUS) ×4 IMPLANT
NDL MAYO 6 CRC TAPER PT (NEEDLE) ×4 IMPLANT
NEEDLE HYPO 22X1.5 SAFETY MO (MISCELLANEOUS) ×3 IMPLANT
NEEDLE MAYO 6 CRC TAPER PT (NEEDLE) ×3 IMPLANT
PACK VAGINAL WOMENS (CUSTOM PROCEDURE TRAY) ×4 IMPLANT
RETRACTOR LONE STAR DISPOSABLE (INSTRUMENTS) ×4 IMPLANT
RETRACTOR STAY HOOK 5MM (MISCELLANEOUS) ×4 IMPLANT
SET CYSTO W/LG BORE CLAMP LF (SET/KITS/TRAYS/PACK) IMPLANT
SLEEVE SCD COMPRESS KNEE MED (STOCKING) ×4 IMPLANT
SPIKE FLUID TRANSFER (MISCELLANEOUS) IMPLANT
SUCTION TUBE FRAZIER 10FR DISP (SUCTIONS) ×4 IMPLANT
SUT ABS MONO DBL WITH NDL 48IN (SUTURE) ×8 IMPLANT
SUT VIC AB 0 CT1 27XBRD ANBCTR (SUTURE) ×4 IMPLANT
SUT VIC AB 2-0 SH 27XBRD (SUTURE) ×4 IMPLANT
SUT VICRYL 2-0 SH 8X27 (SUTURE) IMPLANT
SYR BULB EAR ULCER 3OZ GRN STR (SYRINGE) ×4 IMPLANT
TOWEL GREEN STERILE (TOWEL DISPOSABLE) ×4 IMPLANT
TRAY FOLEY W/BAG SLVR 14FR LF (SET/KITS/TRAYS/PACK) ×4 IMPLANT
WATER STERILE IRR 1000ML POUR (IV SOLUTION) IMPLANT

## 2023-08-01 NOTE — Anesthesia Preprocedure Evaluation (Addendum)
 Anesthesia Evaluation  Patient identified by MRN, date of birth, ID band Patient awake    Reviewed: Allergy & Precautions, NPO status , Patient's Chart, lab work & pertinent test results  History of Anesthesia Complications (+) PONV and history of anesthetic complications  Airway Mallampati: II  TM Distance: >3 FB Neck ROM: Full    Dental  (+) Dental Advisory Given, Missing,    Pulmonary neg pulmonary ROS   Pulmonary exam normal breath sounds clear to auscultation       Cardiovascular negative cardio ROS Normal cardiovascular exam Rhythm:Regular Rate:Normal     Neuro/Psych  Headaches, Seizures - (in setting of takng sudafed), Well Controlled,   negative psych ROS   GI/Hepatic Neg liver ROS,GERD  ,,  Endo/Other  negative endocrine ROS    Renal/GU negative Renal ROS  negative genitourinary   Musculoskeletal  (+) Arthritis ,    Abdominal   Peds  Hematology negative hematology ROS (+)   Anesthesia Other Findings   Reproductive/Obstetrics                             Anesthesia Physical Anesthesia Plan  ASA: 2  Anesthesia Plan: General   Post-op Pain Management: Tylenol PO (pre-op)*   Induction: Intravenous  PONV Risk Score and Plan: 4 or greater and Ondansetron, Dexamethasone, Midazolam, Treatment may vary due to age or medical condition, Scopolamine patch - Pre-op and TIVA  Airway Management Planned: LMA  Additional Equipment:   Intra-op Plan:   Post-operative Plan: Extubation in OR  Informed Consent: I have reviewed the patients History and Physical, chart, labs and discussed the procedure including the risks, benefits and alternatives for the proposed anesthesia with the patient or authorized representative who has indicated his/her understanding and acceptance.     Dental advisory given  Plan Discussed with: CRNA  Anesthesia Plan Comments:        Anesthesia Quick  Evaluation

## 2023-08-01 NOTE — Interval H&P Note (Signed)
 History and Physical Interval Note:  08/01/2023 9:52 AM  [name]  has presented today for surgery, with the diagnosis of prolapse of vaginal vault after hysterectomy.  The various methods of treatment have been discussed with the patient and family. After consideration of risks, benefits and other options for treatment, the patient has consented to  Procedure(s) with comments: POSTERIOR REPAIR WITH SACROSPINOUS FIXATION (N/A) PERINEORRHAPHY (N/A) as a surgical intervention.  The patient's history has been reviewed, patient examined, no change in status, stable for surgery.  I have reviewed the patient's chart and labs.  Questions were answered to the patient's satisfaction.     Marguerita Beards

## 2023-08-01 NOTE — Anesthesia Postprocedure Evaluation (Signed)
 Anesthesia Post Note  Patient: [name]  Procedure(s) Performed: POSTERIOR REPAIR WITH SACROSPINOUS FIXATION (Vagina ) PERINEORRHAPHY (Perineum) EXAM UNDER ANESTHESIA (Vagina )     Patient location during evaluation: PACU Anesthesia Type: General Level of consciousness: awake and alert Pain management: pain level controlled Vital Signs Assessment: post-procedure vital signs reviewed and stable Respiratory status: spontaneous breathing, nonlabored ventilation, respiratory function stable and patient connected to nasal cannula oxygen Cardiovascular status: blood pressure returned to baseline and stable Postop Assessment: no apparent nausea or vomiting Anesthetic complications: no  No notable events documented.  Last Vitals:  Vitals:   08/01/23 1245 08/01/23 1300  BP: 120/65   Pulse: (!) 54 (!) 57  Resp: 10 11  Temp:  36.5 C  SpO2: 98% 96%    Last Pain:  Vitals:   08/01/23 1245  TempSrc:   PainSc: 0-No pain                 Katheryn Culliton L Ofilia Rayon

## 2023-08-01 NOTE — Telephone Encounter (Signed)
 [name] underwent Posterior repair, perineorrhaphy, sacrospinous ligament fixation on 08/01/23.   [name] {passed/ failed:25165} her voiding trial.  ***ml was backfilled into the bladder Voided ***ml  PVR by bladder scan was ***ml.   [name] was discharged {With/Without:20273} a catheter. Please call her for a routine post op check and to schedule a voiding trial by ***. Thanks!  Marguerita Beards, MD

## 2023-08-01 NOTE — Transfer of Care (Signed)
 Immediate Anesthesia Transfer of Care Note  Patient: [name]  Procedure(s) Performed: POSTERIOR REPAIR WITH SACROSPINOUS FIXATION (Vagina ) PERINEORRHAPHY (Perineum) EXAM UNDER ANESTHESIA (Vagina )  Patient Location: PACU  Anesthesia Type:General  Level of Consciousness: drowsy  Airway & Oxygen Therapy: Patient Spontanous Breathing  Post-op Assessment: Report given to RN and Post -op Vital signs reviewed and stable  Post vital signs: Reviewed and stable  Last Vitals:  Vitals Value Taken Time  BP 117/62   Temp 97.6   Pulse 76 08/01/23 1207  Resp 14   SpO2 97 % 08/01/23 1207  Vitals shown include unfiled device data.  Last Pain:  Vitals:   08/01/23 0925  TempSrc: Oral  PainSc: 0-No pain      Patients Stated Pain Goal: 3 (08/01/23 0925)  Complications: No notable events documented.

## 2023-08-01 NOTE — Progress Notes (Signed)
 Voiding Trial Results:  Instilled 300 ml Voided 150 ml Bladder scan 200 ml  Notified Dr. Florian Buff, new order to insert foley catheter.

## 2023-08-01 NOTE — Op Note (Signed)
 Operative Note  Preoperative Diagnosis: posterior vaginal prolapse and vaginal vault prolapse after hysterectomy  Postoperative Diagnosis: same  Procedures performed:  Posterior repair, perineorrhaphy, sacrospinous ligament fixation  Implants: none  Attending Surgeon: Lanetta Inch, MD  Anesthesia: General LMA  Findings: On vaginal exam, stage II prolapse present  Specimens: none  Estimated blood loss: 50 mL  IV fluids: 100 mL  Urine output: 600 mL  Complications: none  Procedure in Detail:  After informed consent was obtained, the patient was taken to the operating room where anesthesia was induced and found to be adequate. She was placed in dorsal lithotomy position, taking care to avoid any traction on the extremities, and then prepped and draped in the usual sterile fashion. A self-retaining lonestar retractor was placed using four elastic blue stays.  After a foley catheter was inserted into the urethra.  Vaginal exam concluded that an apical procedure was also needed. Two Allis clamps were along the posterior vaginal wall defect. 1% lidocaine with epinephrine was injected into the vaginal mucosa.  A vertical incision was made between these two Allis clamps with a 15 blade scalpel.  A diamond shaped incision was made over the perineum. Excess perineal skin was removed with a scalpel. Allis clamps were placed along this incision and Metzenbaum scissors were used to undermine the vaginal mucosa along the incision.  The vaginal mucosa was then sharply dissected off to the septum bilaterally.    For the sacrospinous ligament fixation (SSLF), the ischial spine was accessed on the right side via dissection with Metzenbaum scissors and blunt dissection.  The sacrospinous ligament was palpated. Two 0 PDS suture was then placed at the sacrospinous ligament two fingerbreadths medial to the ischial spine, in order to avoid the pudendal neurovascular bundle, using a Capio needle driver.   The PDS suture was attached to the vaginal epithelium on the ipsilateral side of the vaginal apex and held. There was an enterocele present and this was reduced with 2-0 vicryl pursestring sutures. Posterior plication of the rectovaginal septum was then performed using mattress sutures of 2-0 Vicryl. The last distal stitch incorporated the perineal body in a U stitch fashion. The vaginal mucosal edges were trimmed and the incision reapproximated with 2-0 Vicryl in a running fashion. The SSLF suture was then tied down with excellent support of the posterior and apical vagina.  Attention was then turned to the perineum.  The perineal body was then reapproximated with three interrupted stacking 0-vicryl sutures. The perineal skin was then closed with a 2-0 vicryl in a subcutaneous and subcuticular fashion. Irrigation was performed and good hemostasis was noted. The vagina was copiously irrigated. Two additional figure of eight sutures with 2-0 vicryl were placed along the incision line due to oozing. Hemostasis was noted.  Vaginal packing was not placed.  A rectal examination was normal and confirmed no sutures within the rectum. The patient tolerated the procedure well.  She was awakened from anesthesia and transferred to the recovery room in stable condition. All counts were correct x 2.     Marguerita Beards, MD

## 2023-08-01 NOTE — Discharge Instructions (Addendum)

## 2023-08-01 NOTE — Anesthesia Procedure Notes (Signed)
 Procedure Name: LMA Insertion Date/Time: 08/01/2023 10:44 AM  Performed by: Colbert Coyer, CRNAPre-anesthesia Checklist: Patient identified, Emergency Drugs available, Suction available and Patient being monitored Patient Re-evaluated:Patient Re-evaluated prior to induction Oxygen Delivery Method: Circle System Utilized Preoxygenation: Pre-oxygenation with 100% oxygen Induction Type: IV induction Ventilation: Mask ventilation without difficulty LMA: LMA inserted LMA Size: 4.0 Number of attempts: 1 Placement Confirmation: positive ETCO2 Dental Injury: Teeth and Oropharynx as per pre-operative assessment

## 2023-08-02 ENCOUNTER — Encounter (HOSPITAL_COMMUNITY): Payer: Self-pay | Admitting: Obstetrics and Gynecology

## 2023-08-02 ENCOUNTER — Telehealth: Payer: Self-pay | Admitting: Obstetrics and Gynecology

## 2023-08-02 NOTE — Telephone Encounter (Signed)
error 

## 2023-08-02 NOTE — Telephone Encounter (Signed)
 Left message to return call. Schedule voiding trial for 08-03-2023 @830am 

## 2023-08-03 ENCOUNTER — Ambulatory Visit (INDEPENDENT_AMBULATORY_CARE_PROVIDER_SITE_OTHER)

## 2023-08-03 DIAGNOSIS — N399 Disorder of urinary system, unspecified: Secondary | ICD-10-CM

## 2023-08-03 NOTE — Progress Notes (Signed)
 [name] is a 58 y.o. female is here for  here for a voiding trial.  12 fr cath removed with no complications.  Instilled: 250 ML Voided: 340 ML PVR: 3 ML

## 2023-08-03 NOTE — Telephone Encounter (Signed)
 Patient had her appointment today for the voiding trial. She passed and no replacement foley needed.

## 2023-08-03 NOTE — Patient Instructions (Signed)
  Please drink lots of water and try to expel as much urine as you are inputting. If you are unable to urinate, give the office a call before 2 pm.     Please keep all future appointments and if you have any questions or concerns please feel free to contact our office at [phone].

## 2023-08-05 ENCOUNTER — Ambulatory Visit: Payer: Commercial Managed Care - PPO | Admitting: Obstetrics and Gynecology

## 2023-08-07 ENCOUNTER — Ambulatory Visit (HOSPITAL_BASED_OUTPATIENT_CLINIC_OR_DEPARTMENT_OTHER)
Admission: EM | Admit: 2023-08-07 | Discharge: 2023-08-07 | Disposition: A | Discharge status: Home / Self Care | Attending: Family Medicine | Admitting: Family Medicine

## 2023-08-07 ENCOUNTER — Encounter (HOSPITAL_BASED_OUTPATIENT_CLINIC_OR_DEPARTMENT_OTHER): Payer: Self-pay

## 2023-08-07 DIAGNOSIS — J069 Acute upper respiratory infection, unspecified: Secondary | ICD-10-CM

## 2023-08-07 LAB — POC COVID19/FLU A&B COMBO
Covid Antigen, POC: NEGATIVE
Influenza A Antigen, POC: NEGATIVE
Influenza B Antigen, POC: NEGATIVE

## 2023-08-07 MED ORDER — AZITHROMYCIN 250 MG PO TABS: 250.0000 mg | ORAL_TABLET | Freq: Every day | ORAL | 0 refills | Status: DC

## 2023-08-07 MED ORDER — BENZONATATE 100 MG PO CAPS: 100.0000 mg | ORAL_CAPSULE | Freq: Three times a day (TID) | ORAL | 0 refills | Status: DC

## 2023-08-07 NOTE — Discharge Instructions (Signed)
 Treating you for an upper respiratory infection.  Take the medication as prescribed. Azithromycin for antibiotic coverage and Tessalon Perles for cough.  You can do 1-2 of those every 8 hours as needed. Also recommend Mucinex for congestion and to thin mucus. Stay hydrated and follow-up as needed

## 2023-08-07 NOTE — ED Triage Notes (Signed)
 Recent admission for pelvic prolapse surgery. Began having cough, chest congestion, headache since discharge on Monday. Cough is very moist.

## 2023-08-07 NOTE — ED Provider Notes (Signed)
 [name] C[facility]    CSN: [id number] Arrival date & time: 08/07/23  1012      History   Chief Complaint Chief Complaint  Patient presents with   Cough   chest congestion    HPI [name] is a 58 y.o. female.   Patient is a 58 year old female that presents today with cough, congestion, headache since Monday.  Symptoms have been constant and worsening.  She was recently admitted for surgery due to pelvic prolapse.  She is concerned due to the coughing.  Denies any changes postsurgical. She has been taken over-the-counter medicines for her symptoms without any relief.   Cough   Past Medical History:  Diagnosis Date   Arthritis    GERD (gastroesophageal reflux disease)    Headache    Hyperlipidemia    PONV (postoperative nausea and vomiting)    Seasonal allergies    Seizure (HCC)    after taking sudafed - never proven    Patient Active Problem List   Diagnosis Date Noted   Urinary urgency 05/19/2023   Pelvic pain 05/19/2023   Vaginal vault prolapse after hysterectomy 05/19/2023   Incontinence of feces 05/19/2023   Annual physical exam 07/19/2019   Recurrent left knee instability 11/09/2018   GERD without esophagitis 06/16/2017   Mixed dyslipidemia 06/16/2017   Lumbar back pain 04/24/2013   Cervicalgia 03/13/2013   Myofascial pain on right side 03/13/2013   Tendinopathy of right rotator cuff 03/13/2013   Hypercholesterolemia 09/21/2011    Past Surgical History:  Procedure Laterality Date   ANTERIOR AND POSTERIOR REPAIR WITH SACROSPINOUS FIXATION N/A 08/01/2023   Procedure: POSTERIOR REPAIR WITH SACROSPINOUS FIXATION;  Surgeon: Arma Lamp, MD;  Location: [facility] OR;  Service: Gynecology;  Laterality: N/A;  Total time requested is 1.5hrs   AUGMENTATION MAMMAPLASTY     BLEPHAROPLASTY Bilateral    BREAST ENHANCEMENT SURGERY  1991   breast implants    COLONOSCOPY     COLONOSCOPY WITH PROPOFOL N/A 01/27/2016   Procedure: COLONOSCOPY WITH PROPOFOL;   Surgeon: Ozell Blunt, MD;  Location: [facility] ENDOSCOPY;  Service: Endoscopy;  Laterality: N/A;   ESOPHAGOGASTRODUODENOSCOPY (EGD) WITH PROPOFOL N/A 01/27/2016   Procedure: ESOPHAGOGASTRODUODENOSCOPY (EGD) WITH PROPOFOL;  Surgeon: Ozell Blunt, MD;  Location: [facility] ENDOSCOPY;  Service: Endoscopy;  Laterality: N/A;   EXAM UNDER ANESTHESIA, PELVIC  08/01/2023   Procedure: EXAM UNDER ANESTHESIA;  Surgeon: Arma Lamp, MD;  Location: [facility] OR;  Service: Gynecology;;   LIPOSUCTION     PERINEOPLASTY N/A 08/01/2023   Procedure: PERINEORRHAPHY;  Surgeon: Arma Lamp, MD;  Location: [facility] OR;  Service: Gynecology;  Laterality: N/A;   reverse tummy tuck     VAGINAL HYSTERECTOMY  11/07/2002   Total vaginal hysterectomy, anterior and posterior colporrhaphy,  TVT procedure, cystoscopy.    OB History     Gravida  1   Para  1   Term  1   Preterm      AB      Living  1      SAB      IAB      Ectopic      Multiple      Live Births  1            Home Medications    Prior to Admission medications   Medication Sig Start Date End Date Taking? Authorizing Provider  azithromycin (ZITHROMAX) 250 MG tablet Take 1 tablet (250 mg total) by mouth daily. Take first 2 tablets together, then  1 every day until finished. 08/07/23  Yes Urijah Raynor A, FNP  benzonatate (TESSALON) 100 MG capsule Take 1 capsule (100 mg total) by mouth every 8 (eight) hours. 08/07/23  Yes Brynleigh Sequeira A, FNP  acetaminophen (TYLENOL) 500 MG tablet Take 1 tablet (500 mg total) by mouth every 6 (six) hours as needed (pain). Patient taking differently: Take 1,000 mg by mouth every 6 (six) hours as needed (pain). 07/15/23   Zuleta, Kaitlin G, NP  Armodafinil 150 MG tablet Take 1 tablet (150 mg total) by mouth daily. Patient taking differently: Take 150 mg by mouth daily as needed (work). 03/07/23     calcium-vitamin D (OSCAL WITH D) 500-200 MG-UNIT per tablet Take 1 tablet by mouth daily.    [provider]   esomeprazole (NEXIUM) 40 MG capsule Take 1 capsule (40 mg total) by mouth daily before breakfast. 11/22/22     estradiol (ESTRACE) 0.1 MG/GM vaginal cream Place 0.5 g vaginally 2 (two) times a week. Use starting day 7 postop at the perineal area. 07/18/23   Zuleta, Kaitlin G, NP  Estradiol 10 MCG TABS vaginal tablet Place 1 tablet (10 mcg total) vaginally 2 (two) times a week. 08/13/21     Estradiol 10 MCG TABS vaginal tablet Place 1 tablet (10 mcg total) vaginally 2 (two) times a week. 06/27/23     ezetimibe (ZETIA) 10 MG tablet Take 1 tablet (10 mg total) by mouth daily. 06/24/23     famotidine (PEPCID) 20 MG tablet Take 20 mg by mouth daily as needed for heartburn. 02/11/19   [provider]  fish oil-omega-3 fatty acids 1000 MG capsule Take 1,000 mg by mouth daily.    [provider]  fluticasone (FLONASE) 50 MCG/ACT nasal spray Place 1-2 sprays into both nostrils daily as needed for allergies. 07/12/18   [provider]  ibuprofen (ADVIL) 200 MG tablet Take 200 mg by mouth every 6 (six) hours as needed for moderate pain (pain score 4-6).    [provider]  ibuprofen (ADVIL) 600 MG tablet Take 1 tablet (600 mg total) by mouth every 6 (six) hours as needed. 07/15/23   Zuleta, Kaitlin G, NP  Lifitegrast (XIIDRA) 5 % SOLN Instill 1 drop into both eyes twice a day 05/24/22     loratadine (CLARITIN) 10 MG tablet Take 10 mg by mouth daily as needed for allergies. 07/12/18   [provider]  meloxicam (MOBIC) 15 MG tablet Take 1 tablet (15 mg total) by mouth daily. Patient taking differently: Take 15 mg by mouth daily as needed for pain. 03/07/23     ondansetron (ZOFRAN) 4 MG tablet Take 1 tablet (4 mg total) by mouth every 8 (eight) hours as needed for nausea or vomiting. 07/15/23   Zuleta, Kaitlin G, NP  polyethylene glycol powder (GLYCOLAX/MIRALAX) 17 GM/SCOOP powder Take 17 g by mouth daily. Drink 17g (1 scoop) dissolved in water per day. 07/15/23   Zuleta, Kaitlin  G, NP  pravastatin (PRAVACHOL) 80 MG tablet Take 1 tablet (80 mg total) by mouth  nightly. 11/22/22       Family History Family History  Problem Relation Age of Onset   Diabetes Mellitus II Mother    Sleep apnea Mother    Hypertension Mother    High Cholesterol Mother    Stroke Mother    Hypertension Father    High Cholesterol Father    Alcoholism Father    Cancer Maternal Grandmother        Biliary Cancer  Colon polyps Maternal Grandfather    Diabetes Maternal Grandfather    Alcoholism Son    Breast cancer Maternal Aunt    Hypertension Other    Hyperlipidemia Other     Social History Social History   Tobacco Use   Smoking status: Never   Smokeless tobacco: Never  Vaping Use   Vaping status: Never Used  Substance Use Topics   Alcohol use: No   Drug use: No     Allergies   Codeine   Review of Systems Review of Systems  HENT:  Positive for congestion.   Respiratory:  Positive for cough.      Physical Exam Triage Vital Signs ED Triage Vitals  Encounter Vitals Group     BP 08/07/23 1053 131/84     Systolic BP Percentile --      Diastolic BP Percentile --      Pulse Rate 08/07/23 1053 71     Resp 08/07/23 1053 20     Temp 08/07/23 1053 98.5 F (36.9 C)     Temp Source 08/07/23 1053 Oral     SpO2 08/07/23 1053 95 %     Weight --      Height --      Head Circumference --      Peak Flow --      Pain Score 08/07/23 1055 3     Pain Loc --      Pain Education --      Exclude from Growth Chart --    No data found.  Updated Vital Signs BP 131/84 (BP Location: Right Arm)   Pulse 71   Temp 98.5 F (36.9 C) (Oral)   Resp 20   LMP 05/28/2010   SpO2 95%   Visual Acuity Right Eye Distance:   Left Eye Distance:   Bilateral Distance:    Right Eye Near:   Left Eye Near:    Bilateral Near:     Physical Exam Constitutional:      General: She is not in acute distress.    Appearance: Normal appearance. She is not ill-appearing, toxic-appearing or  diaphoretic.  HENT:     Head: Normocephalic and atraumatic.     Right Ear: Tympanic membrane and ear canal normal.     Left Ear: Tympanic membrane and ear canal normal.     Nose: Congestion and rhinorrhea present.     Mouth/Throat:     Pharynx: Oropharynx is clear.  Eyes:     Conjunctiva/sclera: Conjunctivae normal.  Cardiovascular:     Rate and Rhythm: Normal rate and regular rhythm.     Pulses: Normal pulses.     Heart sounds: Normal heart sounds.  Pulmonary:     Effort: Pulmonary effort is normal.     Breath sounds: Rhonchi present.  Skin:    General: Skin is warm and dry.  Neurological:     Mental Status: She is alert.  Psychiatric:        Mood and Affect: Mood normal.      UC Treatments / Results  Labs (all labs ordered are listed, but only abnormal results are displayed) Labs Reviewed  POC COVID19/FLU A&B COMBO - Normal    EKG   Radiology No results found.  Procedures Procedures (including critical care time)  Medications Ordered in UC Medications - No data to display  Initial Impression / Assessment and Plan / UC Course  I have reviewed the triage vital signs and the nursing notes.  Pertinent labs & imaging  results that were available during my care of the patient were reviewed by me and considered in my medical decision making (see chart for details).     Upper respiratory infection-treating with azithromycin and Tessalon Perles for cough as needed.  Recommend Mucinex for congestion and to thin mucus. Recommend rest and stay hydrated and follow-up as needed Final Clinical Impressions(s) / UC Diagnoses   Final diagnoses:  Upper respiratory tract infection, unspecified type     Discharge Instructions      Treating you for an upper respiratory infection.  Take the medication as prescribed. Azithromycin for antibiotic coverage and Tessalon Perles for cough.  You can do 1-2 of those every 8 hours as needed. Also recommend Mucinex for congestion  and to thin mucus. Stay hydrated and follow-up as needed    ED Prescriptions     Medication Sig Dispense Auth. Provider   azithromycin (ZITHROMAX) 250 MG tablet Take 1 tablet (250 mg total) by mouth daily. Take first 2 tablets together, then 1 every day until finished. 6 tablet Orlandria Kissner A, FNP   benzonatate (TESSALON) 100 MG capsule Take 1 capsule (100 mg total) by mouth every 8 (eight) hours. 21 capsule Landa Pine, FNP      PDMP not reviewed this encounter.   Landa Pine, FNP 08/07/23 1128

## 2023-08-10 ENCOUNTER — Other Ambulatory Visit (HOSPITAL_BASED_OUTPATIENT_CLINIC_OR_DEPARTMENT_OTHER): Payer: Self-pay

## 2023-08-10 DIAGNOSIS — J988 Other specified respiratory disorders: Secondary | ICD-10-CM | POA: Diagnosis not present

## 2023-08-10 MED ORDER — PROMETHAZINE-DM 6.25-15 MG/5ML PO SYRP
ORAL_SOLUTION | ORAL | 0 refills | Status: DC
  Filled 2023-08-10: qty 240, 12d supply, fill #0

## 2023-08-10 MED ORDER — ALBUTEROL SULFATE HFA 108 (90 BASE) MCG/ACT IN AERS
INHALATION_SPRAY | RESPIRATORY_TRACT | 0 refills | Status: AC
  Filled 2023-08-10: qty 6.7, 25d supply, fill #0

## 2023-08-12 ENCOUNTER — Other Ambulatory Visit (HOSPITAL_BASED_OUTPATIENT_CLINIC_OR_DEPARTMENT_OTHER): Payer: Self-pay

## 2023-08-12 MED ORDER — MUPIROCIN 2 % EX OINT
TOPICAL_OINTMENT | Freq: Three times a day (TID) | CUTANEOUS | 0 refills | Status: AC
Start: 2023-08-12 — End: 2023-08-19
  Filled 2023-08-12: qty 22, 7d supply, fill #0

## 2023-08-19 ENCOUNTER — Other Ambulatory Visit (HOSPITAL_BASED_OUTPATIENT_CLINIC_OR_DEPARTMENT_OTHER): Payer: Self-pay

## 2023-08-31 ENCOUNTER — Other Ambulatory Visit (HOSPITAL_BASED_OUTPATIENT_CLINIC_OR_DEPARTMENT_OTHER): Payer: Self-pay

## 2023-08-31 ENCOUNTER — Other Ambulatory Visit (HOSPITAL_COMMUNITY): Payer: Self-pay

## 2023-08-31 MED ORDER — XIIDRA 5 % OP SOLN
1.0000 [drp] | Freq: Two times a day (BID) | OPHTHALMIC | 3 refills | Status: AC
  Filled 2023-08-31 – 2024-02-09 (×3): qty 180, 90d supply, fill #0

## 2023-09-01 ENCOUNTER — Other Ambulatory Visit (HOSPITAL_COMMUNITY): Payer: Self-pay

## 2023-09-01 ENCOUNTER — Other Ambulatory Visit (HOSPITAL_BASED_OUTPATIENT_CLINIC_OR_DEPARTMENT_OTHER): Payer: Self-pay

## 2023-09-02 ENCOUNTER — Other Ambulatory Visit (HOSPITAL_BASED_OUTPATIENT_CLINIC_OR_DEPARTMENT_OTHER): Payer: Self-pay

## 2023-09-12 ENCOUNTER — Encounter: Payer: Commercial Managed Care - PPO | Admitting: Obstetrics and Gynecology

## 2023-09-13 ENCOUNTER — Encounter: Payer: Self-pay | Admitting: Obstetrics and Gynecology

## 2023-09-13 ENCOUNTER — Ambulatory Visit (INDEPENDENT_AMBULATORY_CARE_PROVIDER_SITE_OTHER): Payer: Commercial Managed Care - PPO | Admitting: Obstetrics and Gynecology

## 2023-09-13 VITALS — BP 101/70 | HR 65

## 2023-09-13 DIAGNOSIS — Z48816 Encounter for surgical aftercare following surgery on the genitourinary system: Secondary | ICD-10-CM

## 2023-09-13 DIAGNOSIS — Z9889 Other specified postprocedural states: Secondary | ICD-10-CM

## 2023-09-13 NOTE — Progress Notes (Signed)
 [facility]  Date of Visit: 09/13/2023  History of Present Illness: [name] is a 58 y.o. female scheduled today for a post-operative visit.   Surgery: s/p Posterior repair, perineorrhaphy, sacrospinous ligament fixation on 08/01/23  She did not pass her postoperative void trial. Returned 08/03/23 to office and passed voiding trial.   Postoperative course has been uncomplicated.   Today she reports she had URI and cough after the procedure- but had negative testing with PCP and urgent care. Has a little right buttock pain but no longer needs pain medication. Also has a small amount of discharge. Had a little spotting a few weeks ago.   UTI in the last 6 weeks? No  Pain? Some right buttock pain She has returned to her normal activity (except for postop restrictions) Vaginal bulge? No  Stress incontinence: No  (prior sling) Urgency/frequency: Yes - not all the time Urge incontinence: No Voiding dysfunction: No  Bowel issues: No   Subjective Success: Do you usually have a bulge or something falling out that you can see or feel in the vaginal area? No  Retreatment Success: Any retreatment with surgery or pessary for any compartment? No    Medications: She has a current medication list which includes the following prescription(s): acetaminophen , albuterol , armodafinil , azithromycin , benzonatate , calcium-vitamin d, esomeprazole , estradiol , estradiol , ezetimibe , famotidine, fish oil-omega-3 fatty acids, fluticasone, ibuprofen , ibuprofen , xiidra , loratadine, meloxicam , ondansetron , polyethylene glycol powder, pravastatin , and promethazine -dextromethorphan.   Allergies: Patient is allergic to codeine.   Physical Exam: BP 101/70 (BP Location: Left Arm, Cuff Size: Small)   Pulse 65   LMP 05/28/2010    Pelvic Examination: Vagina: Incisions healing well. Sutures are present at the cuff and there is not granulation tissue. No tenderness along the anterior or posterior vagina. No  apical tenderness. No pelvic masses.   POP-Q: POP-Q  -3                                            Aa   -3                                           Ba  -7                                              C   3                                            Gh  4.5                                            Pb  7                                            tvl   -3  Ap  -3                                            Bp                                                 D    ---------------------------------------------------------  Assessment and Plan:  1. Post-operative state     - Well healed - Can resume regular activity including exercise. Discussed avoidance of heavy lifting and straining long term to reduce the risk of recurrence. Continue to use estrogen cream for at least 2 weeks then can resume intercourse if desired.  - return to work letter provided  Follow up as needed  Shannon Lamp, MD

## 2023-09-20 ENCOUNTER — Other Ambulatory Visit (HOSPITAL_BASED_OUTPATIENT_CLINIC_OR_DEPARTMENT_OTHER): Payer: Self-pay

## 2023-09-21 ENCOUNTER — Other Ambulatory Visit (HOSPITAL_BASED_OUTPATIENT_CLINIC_OR_DEPARTMENT_OTHER): Payer: Self-pay

## 2023-10-27 ENCOUNTER — Other Ambulatory Visit (HOSPITAL_BASED_OUTPATIENT_CLINIC_OR_DEPARTMENT_OTHER): Payer: Self-pay

## 2023-10-27 MED ORDER — ARMODAFINIL 150 MG PO TABS
150.0000 mg | ORAL_TABLET | Freq: Every day | ORAL | 3 refills | Status: AC
  Filled 2023-10-27: qty 30, 30d supply, fill #0

## 2023-10-31 ENCOUNTER — Other Ambulatory Visit (HOSPITAL_BASED_OUTPATIENT_CLINIC_OR_DEPARTMENT_OTHER): Payer: Self-pay

## 2023-11-24 ENCOUNTER — Other Ambulatory Visit (HOSPITAL_BASED_OUTPATIENT_CLINIC_OR_DEPARTMENT_OTHER): Payer: Self-pay

## 2023-12-07 DIAGNOSIS — Z13220 Encounter for screening for lipoid disorders: Secondary | ICD-10-CM | POA: Diagnosis not present

## 2023-12-07 DIAGNOSIS — Z13228 Encounter for screening for other metabolic disorders: Secondary | ICD-10-CM | POA: Diagnosis not present

## 2023-12-07 DIAGNOSIS — R079 Chest pain, unspecified: Secondary | ICD-10-CM | POA: Diagnosis not present

## 2023-12-07 DIAGNOSIS — R0789 Other chest pain: Secondary | ICD-10-CM | POA: Diagnosis not present

## 2023-12-07 DIAGNOSIS — K449 Diaphragmatic hernia without obstruction or gangrene: Secondary | ICD-10-CM | POA: Diagnosis not present

## 2023-12-07 DIAGNOSIS — Z0001 Encounter for general adult medical examination with abnormal findings: Secondary | ICD-10-CM | POA: Diagnosis not present

## 2023-12-07 DIAGNOSIS — K219 Gastro-esophageal reflux disease without esophagitis: Secondary | ICD-10-CM | POA: Diagnosis not present

## 2023-12-07 NOTE — Progress Notes (Signed)
 [facility] - [facility] [place] [address] [place] [address]  Pt is here for a Complete Physical Exam [name] is a 58 y.o. female DOB: [birth year]   Subjective  [name] is a 58 y.o. female who presents for  Chief Complaint  Patient presents with  . Annual Exam  . Nausea    W/ abd pain mid upper, back pain: intermittent for 3-4 months  .  Issues addressed by patient and physician today include:  History of Present Illness The patient presents for a physical exam.  She has been experiencing reflux symptoms, which she manages by being mindful of her diet and meal timings, particularly in the evenings when the symptoms tend to worsen. This morning, she experienced a gnawing sensation, which she does not believe is cardiac-related. She also reports intermittent cramps in her shoulder blades every few weeks. She has a history of a small hiatal hernia and underwent a colonoscopy in 2017. She occasionally experiences diarrhea but does not have constipation. She is currently taking Nexium  for her reflux, which was prescribed due to her nighttime symptoms. She also had tonsil stones and throat stones in the past.  Her last blood work was conducted over a year ago. She had a mammogram this year, which showed no abnormalities. She recently consulted with a urogynecologist. She underwent a hysterectomy and bladder sling procedure performed by Dr. Nikki 20 years ago.  Occupation: Engineer, civil (consulting), inpatient rehab Hobbies: Racquetball  PAST SURGICAL HISTORY: - Hysterectomy and bladder sling procedure 20 years ago  FAMILY HISTORY Her mother has had thyroid issues.    PHQ/GAD Scores       12/07/2023 0935 12/07/2023 0936       Patient Health Questionnaire-9 Score: 1 --     GAD-7 Total Score: -- 0          Review of Systems  Constitutional:  Negative for appetite change, fatigue and fever.  HENT:  Negative for congestion and sore throat.    Eyes: Negative.   Respiratory:  Positive for chest tightness. Negative for cough and shortness of breath.   Cardiovascular:  Negative for chest pain and palpitations.  Gastrointestinal:  Negative for abdominal pain, constipation, diarrhea, nausea and vomiting.  Endocrine: Negative.   Genitourinary:  Negative for dysuria, frequency, menstrual problem, urgency and vaginal discharge.  Musculoskeletal:  Positive for back pain.  Allergic/Immunologic: Negative.   Neurological:  Negative for dizziness, syncope, numbness and headaches.  Hematological: Negative.   Psychiatric/Behavioral:  Negative for decreased concentration. The patient is not nervous/anxious.    __________________________________________________________________  Problem List[1]  Health Maintenance Status       Date Due Completion Dates   HIV Screening Never done ---   Hepatitis C Screening Never done ---   Diabetes Screening 11/23/2023 11/23/2022, 10/23/2021   Influenza Vaccine (1) 11/25/2023 ---   Pneumococcal Vaccine for Ages 50+ (1 of 1 - PCV) 08/09/2024 (Originally 06/16/2015) ---   DTaP/Tdap/Td Vaccines (1 - Tdap) 08/09/2024 (Originally 06/15/1984) ---   Hepatitis B Vaccines (1 of 3 - 19+ 3-dose series) 08/09/2024 (Originally 06/15/1984) ---   COVID-19 Vaccine (4 - 2024-25 season) 08/09/2024 (Originally 12/26/2022) 04/29/2020, 06/04/2019   Depression Screening 08/09/2024 08/10/2023   Comprehensive Annual Visit 12/06/2024 12/07/2023, 11/22/2022   Breast Cancer Screening (Mammogram) 06/29/2025 06/30/2023, 06/04/2022   Comment on 08/29/2019: See [facility] System   Colorectal Cancer Screening 01/26/2026 01/27/2016, 01/27/2016   Adult RSV (60+ Years or Pregnancy) (1 - 1-dose 75+ series) 06/15/2040 ---  Immunization History  Administered Date(s) Administered  . Pfizer SARS-CoV-2 Primary Series 12+ yrs 05/14/2019, 06/04/2019, 04/29/2020  . Varicella Zoster (SHINGRIX ) 18Y+ 10/20/2020, 01/12/2021    Surgical History[2]  Family  History[3]  Tobacco Use: Low Risk  (12/07/2023)   Patient History   . Smoking Tobacco Use: Never   . Smokeless Tobacco Use: Never   . Passive Exposure: Not on file    Allergies[4]  Current Medications[5]  Objective   BP 116/78 (BP Location: Left arm, Patient Position: Sitting)   Pulse 78   Resp 18   Wt 78 kg (172 lb)   SpO2 96%   Breastfeeding No   BMI 28.62 kg/m  Wt Readings from Last 3 Encounters:  12/07/23 78 kg (172 lb)  08/10/23 77.1 kg (170 lb)  11/22/22 74.8 kg (165 lb)   Ht Readings from Last 3 Encounters:  08/10/23 1.651 m (5' 5)  11/22/22 1.651 m (5' 5)  04/27/22 1.651 m (5' 5)   Body mass index is 28.62 kg/m.   PHYSICAL EXAM: Physical Exam Constitutional:      General: She is not in acute distress.    Appearance: Normal appearance. She is normal weight.  HENT:     Head: Normocephalic and atraumatic.     Right Ear: Tympanic membrane, ear canal and external ear normal.     Left Ear: Tympanic membrane, ear canal and external ear normal.     Nose: Nose normal.     Mouth/Throat:     Mouth: Mucous membranes are moist.     Pharynx: Oropharynx is clear.   Eyes:     Extraocular Movements: Extraocular movements intact.     Conjunctiva/sclera: Conjunctivae normal.     Pupils: Pupils are equal, round, and reactive to light.    Cardiovascular:     Rate and Rhythm: Normal rate and regular rhythm.     Pulses: Normal pulses.     Heart sounds: Normal heart sounds. No murmur heard. Pulmonary:     Effort: Pulmonary effort is normal.     Breath sounds: Normal breath sounds.  Abdominal:     General: Abdomen is flat. Bowel sounds are normal. There is no distension.     Palpations: Abdomen is soft.     Tenderness: There is no abdominal tenderness. There is no rebound.     Hernia: A hernia (possible upper abdomen hernia with mild tenderness on exam- hx of small hiatal hernia) is present.   Musculoskeletal:        General: Normal range of motion.      Cervical back: Normal, normal range of motion and neck supple. No tenderness. Normal range of motion.     Thoracic back: Normal. No spasms. Normal range of motion.     Lumbar back: Normal. No tenderness. Normal range of motion.   Skin:    General: Skin is warm and dry.   Neurological:     General: No focal deficit present.     Mental Status: She is alert and oriented to person, place, and time.   Psychiatric:        Mood and Affect: Mood normal.        Behavior: Behavior normal.        Thought Content: Thought content normal.        Judgment: Judgment normal.      Labs: No results found for this or any previous visit (from the past week).    Assessment/Plan   Assessment & Plan Annual physical with abnormal  findings  1. Reflux. - Reports a gnawing sensation and intermittent cramps in the shoulder blades area, potentially related to her known hiatal hernia that is intermittent; feels like it radiates from epigastric region. No other associated symptoms. She is due for repeat colonoscopy and EGD in 2027.  - EKG ordered to rule out any cardiac issues; with no acute findings. Denies chest pain or SOB. Blood work, including thyroid function tests, will be conducted today. - Discussion about referring to a gastroenterologist for further evaluation since EKG results are normal.  - An abdominal ultrasound will be scheduled to assess the gallbladder and other abdominal structures with hx of hiatal hernia. - Advised to continue Nexium  taken before breakfast daily for reflux management.  2. Health Maintenance. - Mammogram conducted this year was normal. - Advised to continue monthly self-breast exams and repeat the mammogram in one year. - Up to date on Pap smear and has recently seen a urogynecologist with recent pelvic surgery earlier this year. TBD repeat for colonoscopy   Follow-up: A follow-up visit is scheduled for next month to ensure improvement or before if symptoms worsen.  Recommended patient go to the ER if she experiences any chest pain or SOB; patient aware of symptoms of ACS in women vary from men.     1. Encounter for routine adult physical exam with abnormal findings (Primary)  2. Gastroesophageal reflux disease without esophagitis - Comprehensive Metabolic Panel; Future - CBC with Differential; Future - Magnesium ; Future  3. Chest tightness - ECG 12 lead; Future - Comprehensive Metabolic Panel; Future - ECG 12 lead  4. Hiatal hernia - US  Abdomen Limited; Future - US  Abdomen Limited  5. Screening for lipid disorders - Lipid Panel; Future  6. Screening for metabolic disorder - TSH With Reflex To Free T4; Future - Comprehensive Metabolic Panel; Future     Orders Placed This Encounter  Procedures  . US  Abdomen Limited  . Lipid Panel  . TSH With Reflex To Free T4  . Comprehensive Metabolic Panel  . CBC with Differential  . Magnesium   . ECG 12 lead     Return in about 4 weeks (around 01/04/2024) for Next scheduled follow up.          [1] Patient Active Problem List Diagnosis  . Mixed dyslipidemia  . GERD without esophagitis  . Recurrent left knee instability  . Annual physical exam  . Chronic tonsillitis  . Gastroesophageal reflux disease without esophagitis  . Globus pharyngeus  . Hiatal hernia  [2] Past Surgical History: Procedure Laterality Date  . BREAST SURGERY     Procedure: BREAST SURGERY  . HYSTERECTOMY      Procedure: HYSTERECTOMY  . TOTAL VAGINAL HYSTERECTOMY    . WISDOM TOOTH EXTRACTION     Procedure: WISDOM TOOTH EXTRACTION  [3] Family History Problem Relation Name Age of Onset  . Diabetes Mother    . Hyperlipidemia Mother    . Hypertension Mother    . Hyperlipidemia Father    . Hypertension Father    . Cancer Maternal Aunt    . Breast cancer Maternal Aunt    [4] Allergies Allergen Reactions  . Codeine GI Intolerance  [5]  Current Outpatient Medications:  .  armodafiniL  (NUVIGIL ) 150 mg  tablet, Take 1 tablet (150 mg total) by mouth daily., Disp: 30 tablet, Rfl: 3 .  calcium carbonate-vitamin D3 (Oyster Shell Calcium-Vit D3) 500 mg (200 mg Ca)-5 mcg (200 units Vit D) per tablet, Take  by mouth., Disp: , Rfl:  .  esomeprazole  (NexIUM ) 40 mg DR capsule, Take 1 capsule (40 mg total) by mouth daily before breakfast., Disp: 90 capsule, Rfl: 3 .  estradioL  (ESTRACE ) 0.01 % (0.1 mg/gram) vaginal cream, Insert 2 g into the vagina daily. Insert 1 gram vaginally twice weekly., Disp: , Rfl:  .  ezetimibe  (ZETIA ) 10 mg tablet, Take 1 tablet (10 mg total) by mouth daily., Disp: 90 tablet, Rfl: 3 .  loratadine (CLARITIN) 10 mg tablet, , Disp: , Rfl:  .  meloxicam  (MOBIC ) 15 mg tablet, Take 1 tablet (15 mg total) by mouth daily., Disp: 90 tablet, Rfl: 0 .  methocarbamoL  (ROBAXIN ) 500 mg tablet, Take 500 mg by mouth 4 (four) times a day., Disp: 40 tablet, Rfl: 1 .  omega-3 acid ethyl esters (LOVAZA) 1 gram capsule, Take 100 mg by mouth., Disp: , Rfl:  .  pravastatin  (PRAVACHOL ) 80 mg tablet, Take 1 tablet (80 mg total) by mouth nightly., Disp: 90 tablet, Rfl: 3 .  Xiidra  5 % dpet ophthalmic solution, Administer 1 drop into each eyes., Disp: , Rfl:  .  albuterol  HFA (PROVENTIL  HFA;VENTOLIN  HFA;PROAIR  HFA) 90 mcg/actuation inhaler, Inhale 2 puffs every 6 (six) hours as needed for wheezing. (Patient not taking: Reported on 12/07/2023), Disp: 1 each, Rfl: 0

## 2023-12-08 ENCOUNTER — Other Ambulatory Visit (HOSPITAL_BASED_OUTPATIENT_CLINIC_OR_DEPARTMENT_OTHER): Payer: Self-pay | Admitting: Student

## 2023-12-08 DIAGNOSIS — K449 Diaphragmatic hernia without obstruction or gangrene: Secondary | ICD-10-CM

## 2023-12-08 DIAGNOSIS — R001 Bradycardia, unspecified: Secondary | ICD-10-CM | POA: Diagnosis not present

## 2023-12-21 ENCOUNTER — Ambulatory Visit (INDEPENDENT_AMBULATORY_CARE_PROVIDER_SITE_OTHER)
Admission: RE | Admit: 2023-12-21 | Discharge: 2023-12-21 | Disposition: A | Discharge status: Home / Self Care | Source: Ambulatory Visit | Attending: Student | Admitting: Student

## 2023-12-21 DIAGNOSIS — R109 Unspecified abdominal pain: Secondary | ICD-10-CM | POA: Diagnosis not present

## 2023-12-21 DIAGNOSIS — K219 Gastro-esophageal reflux disease without esophagitis: Secondary | ICD-10-CM

## 2023-12-21 DIAGNOSIS — K449 Diaphragmatic hernia without obstruction or gangrene: Secondary | ICD-10-CM

## 2024-01-19 ENCOUNTER — Other Ambulatory Visit (HOSPITAL_BASED_OUTPATIENT_CLINIC_OR_DEPARTMENT_OTHER): Payer: Self-pay

## 2024-01-19 DIAGNOSIS — E782 Mixed hyperlipidemia: Secondary | ICD-10-CM | POA: Diagnosis not present

## 2024-01-19 DIAGNOSIS — K2100 Gastro-esophageal reflux disease with esophagitis, without bleeding: Secondary | ICD-10-CM | POA: Diagnosis not present

## 2024-01-19 MED ORDER — ROSUVASTATIN CALCIUM 20 MG PO TABS
20.0000 mg | ORAL_TABLET | Freq: Every day | ORAL | 3 refills | Status: AC
  Filled 2024-01-19: qty 90, 90d supply, fill #0
  Filled 2024-05-02: qty 90, 90d supply, fill #1

## 2024-01-23 ENCOUNTER — Other Ambulatory Visit (HOSPITAL_BASED_OUTPATIENT_CLINIC_OR_DEPARTMENT_OTHER): Payer: Self-pay

## 2024-01-23 MED ORDER — VOQUEZNA 20 MG PO TABS
1.0000 | ORAL_TABLET | Freq: Every day | ORAL | 5 refills | Status: DC
  Filled 2024-01-23: qty 30, 30d supply, fill #0

## 2024-02-09 ENCOUNTER — Other Ambulatory Visit (HOSPITAL_BASED_OUTPATIENT_CLINIC_OR_DEPARTMENT_OTHER): Payer: Self-pay

## 2024-02-13 ENCOUNTER — Other Ambulatory Visit (HOSPITAL_BASED_OUTPATIENT_CLINIC_OR_DEPARTMENT_OTHER): Payer: Self-pay

## 2024-02-13 DIAGNOSIS — L989 Disorder of the skin and subcutaneous tissue, unspecified: Secondary | ICD-10-CM | POA: Diagnosis not present

## 2024-02-13 MED ORDER — SULFAMETHOXAZOLE-TRIMETHOPRIM 800-160 MG PO TABS
1.0000 | ORAL_TABLET | Freq: Two times a day (BID) | ORAL | 0 refills | Status: AC
  Filled 2024-02-13: qty 20, 10d supply, fill #0

## 2024-02-16 ENCOUNTER — Other Ambulatory Visit (HOSPITAL_BASED_OUTPATIENT_CLINIC_OR_DEPARTMENT_OTHER): Payer: Self-pay

## 2024-02-26 ENCOUNTER — Encounter (HOSPITAL_BASED_OUTPATIENT_CLINIC_OR_DEPARTMENT_OTHER): Payer: Self-pay

## 2024-02-26 ENCOUNTER — Ambulatory Visit (HOSPITAL_BASED_OUTPATIENT_CLINIC_OR_DEPARTMENT_OTHER): Admission: EM | Admit: 2024-02-26 | Discharge: 2024-02-26 | Disposition: A | Discharge status: Home / Self Care

## 2024-02-26 DIAGNOSIS — H66002 Acute suppurative otitis media without spontaneous rupture of ear drum, left ear: Secondary | ICD-10-CM | POA: Diagnosis not present

## 2024-02-26 DIAGNOSIS — J069 Acute upper respiratory infection, unspecified: Secondary | ICD-10-CM | POA: Diagnosis not present

## 2024-02-26 DIAGNOSIS — J039 Acute tonsillitis, unspecified: Secondary | ICD-10-CM

## 2024-02-26 LAB — POCT RAPID STREP A (OFFICE): Rapid Strep A Screen: NEGATIVE

## 2024-02-26 LAB — POC COVID19/FLU A&B COMBO
Covid Antigen, POC: NEGATIVE
Influenza A Antigen, POC: NEGATIVE
Influenza B Antigen, POC: NEGATIVE

## 2024-02-26 MED ORDER — TRIAMCINOLONE ACETONIDE 40 MG/ML IJ SUSP
40.0000 mg | Freq: Once | INTRAMUSCULAR | Status: AC
  Administered 2024-02-26: 40 mg via INTRAMUSCULAR

## 2024-02-26 MED ORDER — FLUCONAZOLE 150 MG PO TABS: ORAL_TABLET | ORAL | 0 refills | Status: AC

## 2024-02-26 MED ORDER — CEFDINIR 300 MG PO CAPS: 300.0000 mg | ORAL_CAPSULE | Freq: Two times a day (BID) | ORAL | 0 refills | Status: AC

## 2024-02-26 NOTE — ED Provider Notes (Signed)
 [name]    CSN: [id number] Arrival date & time: 02/26/24  0831      History   Chief Complaint Chief Complaint  Patient presents with   Generalized Body Aches   Fever   Sore Throat    HPI [name] is a 58 y.o. female.   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  She had been out of town on vacation and returned feeling sick.  Symptoms started on 02/24/2024.  She has had fevers of 99.6 and 99.8.  She has had cough, congestion, sore throat, chills, body aches.  She did a home COVID test yesterday that was negative.   Fever Associated symptoms: chills, congestion, cough, ear pain and rhinorrhea   Associated symptoms: no chest pain, no diarrhea, no dysuria, no nausea, no rash, no sore throat and no vomiting   Sore Throat Pertinent negatives include no chest pain, no abdominal pain and no shortness of breath.    Past Medical History:  Diagnosis Date   Arthritis    GERD (gastroesophageal reflux disease)    Headache    Hyperlipidemia    PONV (postoperative nausea and vomiting)    Seasonal allergies    Seizure (HCC)    after taking sudafed - never proven   Sleep apnea     Patient Active Problem List   Diagnosis Date Noted   Urinary urgency 05/19/2023   Pelvic pain 05/19/2023   Vaginal vault prolapse after hysterectomy 05/19/2023   Incontinence of feces 05/19/2023   Annual physical exam 07/19/2019   Recurrent left knee instability 11/09/2018   GERD without esophagitis 06/16/2017   Mixed dyslipidemia 06/16/2017   Lumbar back pain 04/24/2013   Cervicalgia 03/13/2013   Myofascial pain on right side 03/13/2013   Tendinopathy of right rotator cuff 03/13/2013   Hypercholesterolemia 09/21/2011    Past Surgical History:  Procedure Laterality Date   ANTERIOR AND POSTERIOR REPAIR WITH SACROSPINOUS FIXATION N/A 08/01/2023   Procedure: POSTERIOR REPAIR WITH SACROSPINOUS FIXATION;  Surgeon: Marilynne Rosaline SAILOR, MD;  Location: [facility] OR;  Service: Gynecology;   Laterality: N/A;  Total time requested is 1.5hrs   AUGMENTATION MAMMAPLASTY     BLEPHAROPLASTY Bilateral    BREAST ENHANCEMENT SURGERY  1991   breast implants    COLONOSCOPY     COLONOSCOPY WITH PROPOFOL  N/A 01/27/2016   Procedure: COLONOSCOPY WITH PROPOFOL ;  Surgeon: Oliva Boots, MD;  Location: [facility] ENDOSCOPY;  Service: Endoscopy;  Laterality: N/A;   ESOPHAGOGASTRODUODENOSCOPY (EGD) WITH PROPOFOL  N/A 01/27/2016   Procedure: ESOPHAGOGASTRODUODENOSCOPY (EGD) WITH PROPOFOL ;  Surgeon: Oliva Boots, MD;  Location: [facility] ENDOSCOPY;  Service: Endoscopy;  Laterality: N/A;   EXAM UNDER ANESTHESIA, PELVIC  08/01/2023   Procedure: EXAM UNDER ANESTHESIA;  Surgeon: Marilynne Rosaline SAILOR, MD;  Location: [facility] OR;  Service: Gynecology;;   LIPOSUCTION     PERINEOPLASTY N/A 08/01/2023   Procedure: PERINEORRHAPHY;  Surgeon: Marilynne Rosaline SAILOR, MD;  Location: [facility] OR;  Service: Gynecology;  Laterality: N/A;   reverse tummy tuck     VAGINAL HYSTERECTOMY  11/07/2002   Total vaginal hysterectomy, anterior and posterior colporrhaphy,  TVT procedure, cystoscopy.    OB History     Gravida  1   Para  1   Term  1   Preterm      AB      Living  1      SAB      IAB      Ectopic      Multiple  Live Births  1            Home Medications    Prior to Admission medications   Medication Sig Start Date End Date Taking? Authorizing Provider  cefdinir (OMNICEF) 300 MG capsule Take 1 capsule (300 mg total) by mouth 2 (two) times daily for 10 days. 02/26/24 03/07/24 Yes Ival Domino, FNP  fluconazole (DIFLUCAN) 150 MG tablet By mouth today and repeat in 5-7 days for vaginal yeast infection. 02/26/24  Yes Ival Domino, FNP  Vonoprazan Fumarate (VOQUEZNA) 20 MG TABS Take 1 tablet by mouth daily. 02/01/24  Yes [provider]  acetaminophen  (TYLENOL ) 500 MG tablet Take 1 tablet (500 mg total) by mouth every 6 (six) hours as needed (pain). Patient taking differently: Take 1,000 mg by mouth every 6  (six) hours as needed (pain). 07/15/23   Zuleta, Kaitlin G, NP  albuterol  (VENTOLIN  HFA) 108 (90 Base) MCG/ACT inhaler Inhale 2 puffs every 6 (six) hours as needed for wheezing. 08/10/23     Armodafinil  150 MG tablet Take 1 tablet (150 mg total) by mouth daily. 10/27/23     calcium -vitamin D (OSCAL WITH D) 500-200 MG-UNIT per tablet Take 1 tablet by mouth daily.    [provider]  esomeprazole  (NEXIUM ) 40 MG capsule Take 1 capsule (40 mg total) by mouth daily before breakfast. 11/22/22     estradiol  (ESTRACE ) 0.1 MG/GM vaginal cream Place 0.5 g vaginally 2 (two) times a week. Use starting day 7 postop at the perineal area. 07/18/23   Zuleta, Kaitlin G, NP  Estradiol  10 MCG TABS vaginal tablet Place 1 tablet (10 mcg total) vaginally 2 (two) times a week. 08/13/21     ezetimibe  (ZETIA ) 10 MG tablet Take 1 tablet (10 mg total) by mouth daily. 06/24/23     famotidine (PEPCID) 20 MG tablet Take 20 mg by mouth daily as needed for heartburn. 02/11/19   [provider]  fish oil-omega-3 fatty acids 1000 MG capsule Take 1,000 mg by mouth daily.    [provider]  fluticasone (FLONASE) 50 MCG/ACT nasal spray Place 1-2 sprays into both nostrils daily as needed for allergies. 07/12/18   [provider]  ibuprofen  (ADVIL ) 200 MG tablet Take 200 mg by mouth every 6 (six) hours as needed for moderate pain (pain score 4-6).    [provider]  ibuprofen  (ADVIL ) 600 MG tablet Take 1 tablet (600 mg total) by mouth every 6 (six) hours as needed. 07/15/23   Zuleta, Kaitlin G, NP  Lifitegrast  (XIIDRA ) 5 % SOLN Instill 1 drop into both eyes twice a day 08/31/23     loratadine (CLARITIN) 10 MG tablet Take 10 mg by mouth daily as needed for allergies. 07/12/18   [provider]  meloxicam  (MOBIC ) 15 MG tablet Take 1 tablet (15 mg total) by mouth daily. Patient taking differently: Take 15 mg by mouth daily as needed for pain. 03/07/23     ondansetron  (ZOFRAN ) 4 MG tablet Take 1  tablet (4 mg total) by mouth every 8 (eight) hours as needed for nausea or vomiting. 07/15/23   Zuleta, Kaitlin G, NP  polyethylene glycol powder (GLYCOLAX /MIRALAX ) 17 GM/SCOOP powder Take 17 g by mouth daily. Drink 17g (1 scoop) dissolved in water  per day. 07/15/23   Zuleta, Kaitlin G, NP  pravastatin  (PRAVACHOL ) 80 MG tablet Take 1 tablet (80 mg total) by mouth  nightly. 11/22/22     rosuvastatin  (CRESTOR ) 20 MG tablet Take 1 tablet (20 mg total) by mouth daily.  01/19/24       Family History Family History  Problem Relation Age of Onset   Diabetes Mellitus II Mother    Sleep apnea Mother    Hypertension Mother    High Cholesterol Mother    Stroke Mother    Hypertension Father    High Cholesterol Father    Alcoholism Father    Cancer Maternal Grandmother        Biliary Cancer   Colon polyps Maternal Grandfather    Diabetes Maternal Grandfather    Alcoholism Son    Breast cancer Maternal Aunt    Hypertension Other    Hyperlipidemia Other     Social History Social History   Tobacco Use   Smoking status: Never   Smokeless tobacco: Never  Vaping Use   Vaping status: Never Used  Substance Use Topics   Alcohol use: No   Drug use: No     Allergies   Codeine   Review of Systems Review of Systems  Constitutional:  Positive for chills and fever.  HENT:  Positive for congestion, ear pain, postnasal drip and rhinorrhea. Negative for sore throat.   Eyes:  Negative for pain and visual disturbance.  Respiratory:  Positive for cough. Negative for shortness of breath.   Cardiovascular:  Negative for chest pain and palpitations.  Gastrointestinal:  Negative for abdominal pain, constipation, diarrhea, nausea and vomiting.  Genitourinary:  Negative for dysuria and hematuria.  Musculoskeletal:  Positive for arthralgias. Negative for back pain.  Skin:  Negative for color change and rash.  Neurological:  Negative for seizures and syncope.  All other systems reviewed and are  negative.    Physical Exam Triage Vital Signs ED Triage Vitals  Encounter Vitals Group     BP 02/26/24 0949 134/84     Girls Systolic BP Percentile --      Girls Diastolic BP Percentile --      Boys Systolic BP Percentile --      Boys Diastolic BP Percentile --      Pulse Rate 02/26/24 0949 70     Resp 02/26/24 0949 18     Temp 02/26/24 0949 98.3 F (36.8 C)     Temp Source 02/26/24 0949 Oral     SpO2 02/26/24 0949 97 %     Weight --      Height --      Head Circumference --      Peak Flow --      Pain Score 02/26/24 0947 0     Pain Loc --      Pain Education --      Exclude from Growth Chart --    No data found.  Updated Vital Signs BP 134/84 (BP Location: Right Arm)   Pulse 70   Temp 98.3 F (36.8 C) (Oral)   Resp 18   LMP 05/28/2010   SpO2 97%   Visual Acuity Right Eye Distance:   Left Eye Distance:   Bilateral Distance:    Right Eye Near:   Left Eye Near:    Bilateral Near:     Physical Exam Vitals and nursing note reviewed.  Constitutional:      General: She is not in acute distress.    Appearance: She is well-developed. She is ill-appearing. She is not toxic-appearing or diaphoretic.  HENT:     Head: Normocephalic and atraumatic.     Right Ear: Hearing, tympanic membrane, ear canal and external ear normal.     Left Ear: Hearing,  ear canal and external ear normal. A middle ear effusion is present. Tympanic membrane is erythematous and bulging.     Nose: Congestion and rhinorrhea present. Rhinorrhea is clear.     Right Sinus: No maxillary sinus tenderness or frontal sinus tenderness.     Left Sinus: No maxillary sinus tenderness or frontal sinus tenderness.     Mouth/Throat:     Lips: Pink.     Mouth: Mucous membranes are moist.     Pharynx: Uvula midline. Posterior oropharyngeal erythema present. No oropharyngeal exudate.     Tonsils: No tonsillar exudate (Tonsils are erythematous and enlarged but no exudate).  Eyes:     Conjunctiva/sclera:  Conjunctivae normal.     Pupils: Pupils are equal, round, and reactive to light.  Cardiovascular:     Rate and Rhythm: Normal rate and regular rhythm.     Heart sounds: S1 normal and S2 normal. No murmur heard. Pulmonary:     Effort: Pulmonary effort is normal. No respiratory distress.     Breath sounds: Normal breath sounds. No decreased breath sounds, wheezing, rhonchi or rales.  Abdominal:     General: Bowel sounds are normal.     Palpations: Abdomen is soft.     Tenderness: There is no abdominal tenderness.  Musculoskeletal:        General: No swelling.     Cervical back: Neck supple.  Lymphadenopathy:     Head:     Right side of head: Tonsillar adenopathy present. No submental, submandibular, preauricular or posterior auricular adenopathy.     Left side of head: Tonsillar adenopathy present. No submental, submandibular, preauricular or posterior auricular adenopathy.     Cervical: Cervical adenopathy present.     Right cervical: Superficial cervical adenopathy present.     Left cervical: Superficial cervical adenopathy present.  Skin:    General: Skin is warm and dry.     Capillary Refill: Capillary refill takes less than 2 seconds.     Findings: No rash.  Neurological:     Mental Status: She is alert and oriented to person, place, and time.  Psychiatric:        Mood and Affect: Mood normal.      UC Treatments / Results  Labs (all labs ordered are listed, but only abnormal results are displayed) Labs Reviewed  POC COVID19/FLU A&B COMBO - Normal  POCT RAPID STREP A (OFFICE) - Normal    EKG   Radiology No results found.  Procedures Procedures (including critical care time)  Medications Ordered in UC Medications  triamcinolone  acetonide (KENALOG -40) injection 40 mg (40 mg Intramuscular Given 02/26/24 1034)    Initial Impression / Assessment and Plan / UC Course  I have reviewed the triage vital signs and the nursing notes.  Pertinent labs & imaging results  that were available during my care of the patient were reviewed by me and considered in my medical decision making (see chart for details).  Plan of Care: Viral upper respiratory infection with tonsillitis and left otitis media: Kenalog  40 mg injection now (this is a steroid injection).  Cefdinir 300 mg twice daily for 10 days for the left ear infection.  Get plenty of fluids and rest.  Fluconazole, 150 mg, 1 pill now and repeat in 5 to 7 days, if needed for vaginal yeast infection due to steroid and antibiotic use.  Work excuse provided.  Follow-up if symptoms do not improve, worsen or new symptoms occur.  I reviewed the plan of care with the  patient and/or the patient's guardian.  The patient and/or guardian had time to ask questions and acknowledged that the questions were answered.  I provided instruction on symptoms or reasons to return here or to go to an ER, if symptoms/condition did not improve, worsened or if new symptoms occurred.  Final Clinical Impressions(s) / UC Diagnoses   Final diagnoses:  Non-recurrent acute suppurative otitis media of left ear without spontaneous rupture of tympanic membrane  Viral URI with cough  Acute tonsillitis, unspecified etiology     Discharge Instructions      Viral upper respiratory infection with tonsillitis and left otitis media: Kenalog  40 mg injection now (this is a steroid injection).  Cefdinir 300 mg twice daily for 10 days for the left ear infection.  Get plenty of fluids and rest.  Fluconazole, 150 mg, 1 pill now and repeat in 5 to 7 days, if needed for vaginal yeast infection due to steroid and antibiotic use.  Work excuse provided.  Follow-up if symptoms do not improve, worsen or new symptoms occur.     ED Prescriptions     Medication Sig Dispense Auth. Provider   cefdinir (OMNICEF) 300 MG capsule Take 1 capsule (300 mg total) by mouth 2 (two) times daily for 10 days. 20 capsule Ival Domino, FNP   fluconazole (DIFLUCAN) 150 MG  tablet By mouth today and repeat in 5-7 days for vaginal yeast infection. 2 tablet Shayaan Parke, FNP      PDMP not reviewed this encounter.   Ival Domino, FNP 02/26/24 1042

## 2024-02-26 NOTE — ED Triage Notes (Signed)
 Went to [employer] and came home with sickness About two days started with chills, body aches,  Did a COVID test at home and was negative  Shivering  Fever last night  Congestion Sore throat

## 2024-02-26 NOTE — Discharge Instructions (Addendum)
 Viral upper respiratory infection with tonsillitis and left otitis media: Kenalog  40 mg injection now (this is a steroid injection).  Cefdinir 300 mg twice daily for 10 days for the left ear infection.  Get plenty of fluids and rest.  Fluconazole, 150 mg, 1 pill now and repeat in 5 to 7 days, if needed for vaginal yeast infection due to steroid and antibiotic use.  Work excuse provided.  Follow-up if symptoms do not improve, worsen or new symptoms occur.

## 2024-04-10 ENCOUNTER — Inpatient Hospital Stay (HOSPITAL_BASED_OUTPATIENT_CLINIC_OR_DEPARTMENT_OTHER): Admission: RE | Admit: 2024-04-10 | Discharge: 2024-04-10 | Payer: Self-pay | Attending: Family Medicine

## 2024-04-10 ENCOUNTER — Encounter (HOSPITAL_BASED_OUTPATIENT_CLINIC_OR_DEPARTMENT_OTHER): Payer: Self-pay

## 2024-04-10 ENCOUNTER — Ambulatory Visit (INDEPENDENT_AMBULATORY_CARE_PROVIDER_SITE_OTHER): Admit: 2024-04-10 | Discharge: 2024-04-10 | Disposition: A | Discharge status: Home / Self Care | Admitting: Radiology

## 2024-04-10 VITALS — BP 130/83 | HR 64 | Temp 98.2°F | Resp 20

## 2024-04-10 DIAGNOSIS — R059 Cough, unspecified: Secondary | ICD-10-CM

## 2024-04-10 DIAGNOSIS — J209 Acute bronchitis, unspecified: Secondary | ICD-10-CM | POA: Diagnosis not present

## 2024-04-10 MED ORDER — PREDNISONE 20 MG PO TABS
40.0000 mg | ORAL_TABLET | Freq: Every day | ORAL | 0 refills | Status: AC
  Filled 2024-04-10: qty 10, 5d supply, fill #0

## 2024-04-10 MED ORDER — AZITHROMYCIN 250 MG PO TABS
250.0000 mg | ORAL_TABLET | Freq: Every day | ORAL | 0 refills | Status: AC
  Filled 2024-04-10: qty 6, 5d supply, fill #0

## 2024-04-10 MED ORDER — ALBUTEROL SULFATE HFA 108 (90 BASE) MCG/ACT IN AERS
1.0000 | INHALATION_SPRAY | Freq: Four times a day (QID) | RESPIRATORY_TRACT | 0 refills | Status: AC | PRN
  Filled 2024-04-10: qty 6.7, 25d supply, fill #0

## 2024-04-10 NOTE — ED Provider Notes (Signed)
 [name]    CSN: [id number] Arrival date & time: 04/10/24  1843      History   Chief Complaint Chief Complaint  Patient presents with   Cough    Coughing and wheezing for 9 days. - Entered by patient    HPI [name] is a 58 y.o. female.   States cough x 2 weeks. States productive and just feeling bad. Concerned for bronchitis. States is remaining active. Still playing pickleball. Lots of drainage. Hard cough. Using guafenisin.    Cough   Past Medical History:  Diagnosis Date   Arthritis    GERD (gastroesophageal reflux disease)    Headache    Hyperlipidemia    PONV (postoperative nausea and vomiting)    Seasonal allergies    Seizure (HCC)    after taking sudafed - never proven   Sleep apnea     Patient Active Problem List   Diagnosis Date Noted   Urinary urgency 05/19/2023   Pelvic pain 05/19/2023   Vaginal vault prolapse after hysterectomy 05/19/2023   Incontinence of feces 05/19/2023   Annual physical exam 07/19/2019   Recurrent left knee instability 11/09/2018   GERD without esophagitis 06/16/2017   Mixed dyslipidemia 06/16/2017   Lumbar back pain 04/24/2013   Cervicalgia 03/13/2013   Myofascial pain on right side 03/13/2013   Tendinopathy of right rotator cuff 03/13/2013   Hypercholesterolemia 09/21/2011    Past Surgical History:  Procedure Laterality Date   ANTERIOR AND POSTERIOR REPAIR WITH SACROSPINOUS FIXATION N/A 08/01/2023   Procedure: POSTERIOR REPAIR WITH SACROSPINOUS FIXATION;  Surgeon: Marilynne Rosaline SAILOR, MD;  Location: [facility] OR;  Service: Gynecology;  Laterality: N/A;  Total time requested is 1.5hrs   AUGMENTATION MAMMAPLASTY     BLEPHAROPLASTY Bilateral    BREAST ENHANCEMENT SURGERY  1991   breast implants    COLONOSCOPY     COLONOSCOPY WITH PROPOFOL  N/A 01/27/2016   Procedure: COLONOSCOPY WITH PROPOFOL ;  Surgeon: Oliva Boots, MD;  Location: [facility] ENDOSCOPY;  Service: Endoscopy;  Laterality: N/A;    ESOPHAGOGASTRODUODENOSCOPY (EGD) WITH PROPOFOL  N/A 01/27/2016   Procedure: ESOPHAGOGASTRODUODENOSCOPY (EGD) WITH PROPOFOL ;  Surgeon: Oliva Boots, MD;  Location: [facility] ENDOSCOPY;  Service: Endoscopy;  Laterality: N/A;   EXAM UNDER ANESTHESIA, PELVIC  08/01/2023   Procedure: EXAM UNDER ANESTHESIA;  Surgeon: Marilynne Rosaline SAILOR, MD;  Location: [facility] OR;  Service: Gynecology;;   LIPOSUCTION     PERINEOPLASTY N/A 08/01/2023   Procedure: PERINEORRHAPHY;  Surgeon: Marilynne Rosaline SAILOR, MD;  Location: [facility] OR;  Service: Gynecology;  Laterality: N/A;   reverse tummy tuck     VAGINAL HYSTERECTOMY  11/07/2002   Total vaginal hysterectomy, anterior and posterior colporrhaphy,  TVT procedure, cystoscopy.    OB History     Gravida  1   Para  1   Term  1   Preterm      AB      Living  1      SAB      IAB      Ectopic      Multiple      Live Births  1            Home Medications    Prior to Admission medications  Medication Sig Start Date End Date Taking? Authorizing Provider  albuterol  (VENTOLIN  HFA) 108 (90 Base) MCG/ACT inhaler Inhale 1-2 puffs into the lungs every 6 (six) hours as needed for wheezing or shortness of breath. 04/10/24  Yes Adah Wilbert DELENA, FNP  azithromycin  (  ZITHROMAX ) 250 MG tablet Take 1 tablet (250 mg total) by mouth daily. Take first 2 tablets together, then 1 every day until finished. 04/10/24  Yes Zigmund Linse A, FNP  predniSONE  (DELTASONE ) 20 MG tablet Take 2 tablets (40 mg total) by mouth daily with breakfast for 5 days. 04/10/24 04/15/24 Yes Romelo Sciandra A, FNP  acetaminophen  (TYLENOL ) 500 MG tablet Take 1 tablet (500 mg total) by mouth every 6 (six) hours as needed (pain). Patient taking differently: Take 1,000 mg by mouth every 6 (six) hours as needed (pain). 07/15/23   Zuleta, Kaitlin G, NP  albuterol  (VENTOLIN  HFA) 108 (90 Base) MCG/ACT inhaler Inhale 2 puffs every 6 (six) hours as needed for wheezing. 08/10/23     Armodafinil  150 MG tablet Take 1 tablet (150 mg  total) by mouth daily. 10/27/23     calcium -vitamin D (OSCAL WITH D) 500-200 MG-UNIT per tablet Take 1 tablet by mouth daily.    [provider]  esomeprazole  (NEXIUM ) 40 MG capsule Take 1 capsule (40 mg total) by mouth daily before breakfast. 11/22/22     estradiol  (ESTRACE ) 0.1 MG/GM vaginal cream Place 0.5 g vaginally 2 (two) times a week. Use starting day 7 postop at the perineal area. 07/18/23   Zuleta, Kaitlin G, NP  Estradiol  10 MCG TABS vaginal tablet Place 1 tablet (10 mcg total) vaginally 2 (two) times a week. 08/13/21     ezetimibe  (ZETIA ) 10 MG tablet Take 1 tablet (10 mg total) by mouth daily. 06/24/23     famotidine (PEPCID) 20 MG tablet Take 20 mg by mouth daily as needed for heartburn. 02/11/19   [provider]  fish oil-omega-3 fatty acids 1000 MG capsule Take 1,000 mg by mouth daily.    [provider]  fluconazole  (DIFLUCAN ) 150 MG tablet By mouth today and repeat in 5-7 days for vaginal yeast infection. 02/26/24   Ival Domino, FNP  fluticasone (FLONASE) 50 MCG/ACT nasal spray Place 1-2 sprays into both nostrils daily as needed for allergies. 07/12/18   [provider]  ibuprofen  (ADVIL ) 200 MG tablet Take 200 mg by mouth every 6 (six) hours as needed for moderate pain (pain score 4-6).    [provider]  ibuprofen  (ADVIL ) 600 MG tablet Take 1 tablet (600 mg total) by mouth every 6 (six) hours as needed. 07/15/23   Zuleta, Kaitlin G, NP  Lifitegrast  (XIIDRA ) 5 % SOLN Instill 1 drop into both eyes twice a day 08/31/23     loratadine (CLARITIN) 10 MG tablet Take 10 mg by mouth daily as needed for allergies. 07/12/18   [provider]  meloxicam  (MOBIC ) 15 MG tablet Take 1 tablet (15 mg total) by mouth daily. Patient taking differently: Take 15 mg by mouth daily as needed for pain. 03/07/23     ondansetron  (ZOFRAN ) 4 MG tablet Take 1 tablet (4 mg total) by mouth every 8 (eight) hours as needed for nausea or vomiting. 07/15/23   Zuleta,  Kaitlin G, NP  polyethylene glycol powder (GLYCOLAX /MIRALAX ) 17 GM/SCOOP powder Take 17 g by mouth daily. Drink 17g (1 scoop) dissolved in water  per day. 07/15/23   Zuleta, Kaitlin G, NP  pravastatin  (PRAVACHOL ) 80 MG tablet Take 1 tablet (80 mg total) by mouth  nightly. 11/22/22     rosuvastatin  (CRESTOR ) 20 MG tablet Take 1 tablet (20 mg total) by mouth daily. 01/19/24     Vonoprazan Fumarate  (VOQUEZNA ) 20 MG TABS Take 1 tablet by mouth daily. 02/01/24   [provider]  Family History Family History  Problem Relation Age of Onset   Diabetes Mellitus II Mother    Sleep apnea Mother    Hypertension Mother    High Cholesterol Mother    Stroke Mother    Hypertension Father    High Cholesterol Father    Alcoholism Father    Cancer Maternal Grandmother        Biliary Cancer   Colon polyps Maternal Grandfather    Diabetes Maternal Grandfather    Alcoholism Son    Breast cancer Maternal Aunt    Hypertension Other    Hyperlipidemia Other     Social History Social History[1]   Allergies   Codeine   Review of Systems Review of Systems  Respiratory:  Positive for cough.      Physical Exam Triage Vital Signs ED Triage Vitals  Encounter Vitals Group     BP 04/10/24 1935 130/83     Girls Systolic BP Percentile --      Girls Diastolic BP Percentile --      Boys Systolic BP Percentile --      Boys Diastolic BP Percentile --      Pulse Rate 04/10/24 1935 64     Resp 04/10/24 1935 20     Temp 04/10/24 1935 98.2 F (36.8 C)     Temp Source 04/10/24 1935 Oral     SpO2 04/10/24 1935 97 %     Weight --      Height --      Head Circumference --      Peak Flow --      Pain Score 04/10/24 1938 1     Pain Loc --      Pain Education --      Exclude from Growth Chart --    No data found.  Updated Vital Signs BP 130/83 (BP Location: Right Arm)   Pulse 64   Temp 98.2 F (36.8 C) (Oral)   Resp 20   LMP 05/28/2010   SpO2 97%   Visual Acuity Right Eye Distance:    Left Eye Distance:   Bilateral Distance:    Right Eye Near:   Left Eye Near:    Bilateral Near:     Physical Exam   UC Treatments / Results  Labs (all labs ordered are listed, but only abnormal results are displayed) Labs Reviewed - No data to display  EKG   Radiology DG Chest 2 View Result Date: 04/10/2024 EXAM: 2 VIEW(S) XRAY OF THE CHEST 04/10/2024 07:53:00 PM COMPARISON: Chest x-ray 07/31/2022. CLINICAL HISTORY: cough FINDINGS: LUNGS AND PLEURA: No focal pulmonary opacity. No pleural effusion. No pneumothorax. HEART AND MEDIASTINUM: Mild atherosclerotic plaque. No acute abnormality of the cardiac and mediastinal silhouettes. BONES AND SOFT TISSUES: No acute osseous abnormality. IMPRESSION: 1. No acute findings. Electronically signed by: Morgane Naveau MD 04/10/2024 07:55 PM EST RP Workstation: [id number]    Procedures Procedures (including critical care time)  Medications Ordered in UC Medications - No data to display  Initial Impression / Assessment and Plan / UC Course  I have reviewed the triage vital signs and the nursing notes.  Pertinent labs & imaging results that were available during my care of the patient were reviewed by me and considered in my medical decision making (see chart for details).     *** Final Clinical Impressions(s) / UC Diagnoses   Final diagnoses:  Acute bronchitis, unspecified organism     Discharge Instructions      Treating you for  bronchitis.  Take the antibiotics, prednisone  and use the inhaler as prescribed as needed.  Continue the Mucinex.  Cough syrup as needed.  Follow-up as needed   ED Prescriptions     Medication Sig Dispense Auth. Provider   azithromycin  (ZITHROMAX ) 250 MG tablet Take 1 tablet (250 mg total) by mouth daily. Take first 2 tablets together, then 1 every day until finished. 6 tablet Mariana Goytia A, FNP   predniSONE  (DELTASONE ) 20 MG tablet Take 2 tablets (40 mg total) by mouth daily with breakfast for 5  days. 10 tablet Samba Cumba A, FNP   albuterol  (VENTOLIN  HFA) 108 (90 Base) MCG/ACT inhaler Inhale 1-2 puffs into the lungs every 6 (six) hours as needed for wheezing or shortness of breath. 1 each Adah Wilbert LABOR, FNP      PDMP not reviewed this encounter.    [1]  Social History Tobacco Use   Smoking status: Never   Smokeless tobacco: Never  Vaping Use   Vaping status: Never Used  Substance Use Topics   Alcohol use: No   Drug use: No

## 2024-04-10 NOTE — ED Triage Notes (Signed)
 States cough x 2 weeks. States productive and just feeling bad. Concerned for bronchitis. States is remaining active. Still playing pickleball. Lots of drainage. Hard cough. Using guafenisin.

## 2024-04-10 NOTE — Discharge Instructions (Signed)
 Treating you for bronchitis.  Take the antibiotics, prednisone  and use the inhaler as prescribed as needed.  Continue the Mucinex.  Cough syrup as needed.  Follow-up as needed

## 2024-04-11 ENCOUNTER — Other Ambulatory Visit (HOSPITAL_BASED_OUTPATIENT_CLINIC_OR_DEPARTMENT_OTHER): Payer: Self-pay

## 2024-04-12 ENCOUNTER — Other Ambulatory Visit (HOSPITAL_BASED_OUTPATIENT_CLINIC_OR_DEPARTMENT_OTHER): Payer: Self-pay

## 2024-04-25 ENCOUNTER — Other Ambulatory Visit (HOSPITAL_BASED_OUTPATIENT_CLINIC_OR_DEPARTMENT_OTHER): Payer: Self-pay

## 2024-05-02 ENCOUNTER — Other Ambulatory Visit (HOSPITAL_BASED_OUTPATIENT_CLINIC_OR_DEPARTMENT_OTHER): Payer: Self-pay

## 2024-05-07 ENCOUNTER — Encounter: Payer: Self-pay | Admitting: *Deleted
